# Patient Record
Sex: Female | Born: 2000 | Race: Black or African American | Hispanic: No | Marital: Single | Smoking: Never smoker
Health system: Southern US, Community
[De-identification: ages and names within clinical notes are randomized; demographics above are authoritative.]

## PROBLEM LIST (undated history)

## (undated) ENCOUNTER — Ambulatory Visit: Payer: Medicaid Other

## (undated) DIAGNOSIS — J45909 Unspecified asthma, uncomplicated: Secondary | ICD-10-CM

## (undated) DIAGNOSIS — H469 Unspecified optic neuritis: Secondary | ICD-10-CM

## (undated) HISTORY — DX: Unspecified optic neuritis: H46.9

---
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
## 2021-01-16 ENCOUNTER — Ambulatory Visit: Payer: Medicaid Other

## 2021-02-27 ENCOUNTER — Ambulatory Visit (INDEPENDENT_AMBULATORY_CARE_PROVIDER_SITE_OTHER): Payer: Medicaid Other

## 2021-02-27 ENCOUNTER — Other Ambulatory Visit: Payer: Self-pay

## 2021-02-27 VITALS — BP 116/77 | HR 90 | Ht 66.5 in | Wt 263.0 lb

## 2021-02-27 DIAGNOSIS — Z3042 Encounter for surveillance of injectable contraceptive: Secondary | ICD-10-CM

## 2021-02-27 DIAGNOSIS — Z01419 Encounter for gynecological examination (general) (routine) without abnormal findings: Secondary | ICD-10-CM

## 2021-02-27 DIAGNOSIS — Z6841 Body Mass Index (BMI) 40.0 and over, adult: Secondary | ICD-10-CM

## 2021-02-27 DIAGNOSIS — Z0000 Encounter for general adult medical examination without abnormal findings: Secondary | ICD-10-CM

## 2021-02-27 MED ORDER — MEDROXYPROGESTERONE ACETATE 150 MG/ML IM SUSP
150.0000 mg | INTRAMUSCULAR | Status: DC
  Administered 2021-02-27: 150 mg via INTRAMUSCULAR

## 2021-02-27 NOTE — Progress Notes (Signed)
GYNECOLOGY OFFICE VISIT NOTE-WELL WOMAN EXAM  History:   Breanna Dominguez [id number] here today for depo provera prescription and annual exam. She states that she had an appt last week with her PCP and completed all STD testing. She denies regular menses, but has some intermittent spotting.  She denies any abnormal vaginal discharge, pelvic pain or other concerns.   Birth Control:  Depo Provera-No problems, Satisfied.  Reproductive Concerns Sexually Active: Yes-Condom Usage-No pain or discomfort.  Partners Type: One Number of partners in last year: One STD Testing: None  Vaginal/GU Concerns: She reports some mild constipation and states she does nothing to treat. She denies issues with urination.  Breast Concerns/Exams: None; Checks breast and endorses SBA. Denies a family history of breast, uterine, cervical, or ovarian cancer  Medical and Nutrition PCP: [facility] Significant PMx:-None Exercise: Denies Tobacco/Drugs/Alcohol: None Nutrition: She states her intake "could be better." Has referral to nutritional services through PCP.    Social Safety at home: Endorses DV/A: Denies Social Support: Endorses Employment: Statistician School: [employer]  History reviewed. No pertinent past medical history.  History reviewed. No pertinent surgical history.  The following portions of the patient's history were reviewed and updated as appropriate: allergies, current medications, past family history, past medical history, past social history, past surgical history and problem list.   Health Maintenance:  No pap d/t age.  No mammogram d./t age.   Review of Systems:  Pertinent items noted in HPI and remainder of comprehensive ROS otherwise negative.    Objective:    Physical Exam BP 116/77   Pulse 90   Ht 5' 6.5" (1.689 m)   Wt 263 lb (119.3 kg)   BMI 41.81 kg/m  Physical Exam Vitals reviewed.  Constitutional:      General: She is not in acute distress.     Appearance: Normal appearance. She is obese. She is not ill-appearing.  HENT:     Head: Normocephalic and atraumatic.  Eyes:     Conjunctiva/sclera: Conjunctivae normal.  Neck:     Thyroid: No thyroid mass or thyroid tenderness.  Cardiovascular:     Rate and Rhythm: Normal rate and regular rhythm.     Heart sounds: Normal heart sounds.  Pulmonary:     Effort: Pulmonary effort is normal. No respiratory distress.     Breath sounds: Normal breath sounds.  Abdominal:     General: Bowel sounds are normal.     Palpations: Abdomen is soft.     Tenderness: There is no abdominal tenderness.  Musculoskeletal:        General: Normal range of motion.     Cervical back: Normal range of motion.     Right lower leg: No edema.     Left lower leg: No edema.  Skin:    General: Skin is warm and dry.  Neurological:     Mental Status: She is alert and oriented to person, place, and time.  Psychiatric:        Mood and Affect: Mood normal.        Behavior: Behavior normal.        Thought Content: Thought content normal.     Labs and Imaging No results found for this or any previous visit (from the past 168 hour(s)). No results found.   Assessment & Plan:  20 year old Female Well Woman Exam No Pap d/t Age Depo Provera Usage STD Screen Completed by PCP BMI 41.8  1. Well woman exam without gynecological  exam Brief review of well woman exam and what to expect: *Informed that formal speculum exam with pap smear to begin at 20 years old based on ASCCP guidelines. *Informed that CBE will start at age 72 based on ACOG guidelines unless other factors arise prior to that. -Encouraged to activate and utilize Mychart for review of labs, questions, and scheduling of appt.  -Educated on AHA exercise recommendations of 30 minutes of moderate to vigorous activity at least 5x/week. -Encouraged to continue balanced nutritional intake.  2. Encounter for management and injection of  depo-Provera -Prescription UTD as of 12/2020 and Refills of 3. -Will receive injection today. -Instructed to call pharmacy for filling of refills before going to pick up.  3. Adult BMI 40.0-44.9 kg/sq m (HCC) -BMI 41.81 -20lb weight gain since last annual exam with this provider. -Has nutritional consult planned via PCP. -Encouraged exercise.   Routine preventative health maintenance measures emphasized. Please refer to After Visit Summary for other counseling recommendations.   No follow-ups on file.      Cherre Robins, CNM 02/27/2021

## 2021-02-27 NOTE — Progress Notes (Signed)
Pt is in the office for annual. Pt reports that she previously did std testing at PCP, pt needs refills on depo rx today. Last depo injection in office was 12-12-2020 Administered depo injection today in RUOQ and pt tolerated well .Marland Kitchen Administrations This Visit     medroxyPROGESTERone (DEPO-PROVERA) injection 150 mg     Admin Date 02/27/2021 Action Given Dose 150 mg Route Intramuscular Administered By Katrina Stack, RN

## 2021-03-02 ENCOUNTER — Other Ambulatory Visit: Payer: Self-pay

## 2021-03-02 ENCOUNTER — Encounter (HOSPITAL_COMMUNITY): Payer: Self-pay | Admitting: *Deleted

## 2021-03-02 ENCOUNTER — Emergency Department (HOSPITAL_COMMUNITY)
Admission: EM | Admit: 2021-03-02 | Discharge: 2021-03-03 | Discharge status: Against Medical Advice | Payer: Medicaid Other | Attending: Emergency Medicine | Admitting: Emergency Medicine

## 2021-03-02 DIAGNOSIS — R519 Headache, unspecified: Secondary | ICD-10-CM | POA: Diagnosis not present

## 2021-03-02 DIAGNOSIS — Z5321 Procedure and treatment not carried out due to patient leaving prior to being seen by health care provider: Secondary | ICD-10-CM | POA: Diagnosis not present

## 2021-03-02 DIAGNOSIS — R112 Nausea with vomiting, unspecified: Secondary | ICD-10-CM | POA: Insufficient documentation

## 2021-03-02 LAB — COMPREHENSIVE METABOLIC PANEL
ALT: 20 U/L (ref 0–44)
AST: 20 U/L (ref 15–41)
Albumin: 3.8 g/dL (ref 3.5–5.0)
Alkaline Phosphatase: 63 U/L (ref 38–126)
Anion gap: 11 (ref 5–15)
BUN: 7 mg/dL (ref 6–20)
CO2: 20 mmol/L — ABNORMAL LOW (ref 22–32)
Calcium: 9.3 mg/dL (ref 8.9–10.3)
Chloride: 105 mmol/L (ref 98–111)
Creatinine, Ser: 0.94 mg/dL (ref 0.44–1.00)
GFR, Estimated: >60 mL/min (ref >60)
Glucose, Bld: 95 mg/dL (ref 70–99)
Potassium: 3.6 mmol/L (ref 3.5–5.1)
Sodium: 136 mmol/L (ref 135–145)
Total Bilirubin: 1.2 mg/dL (ref 0.3–1.2)
Total Protein: 7.1 g/dL (ref 6.5–8.1)

## 2021-03-02 LAB — LIPASE, BLOOD: Lipase: 22 U/L (ref 11–51)

## 2021-03-02 LAB — CBC WITH DIFFERENTIAL/PLATELET
Abs Immature Granulocytes: 0.07 10*3/uL (ref 0.00–0.07)
Basophils Absolute: 0.1 10*3/uL (ref 0.0–0.1)
Basophils Relative: 0 %
Eosinophils Absolute: 0 10*3/uL (ref 0.0–0.5)
Eosinophils Relative: 0 %
HCT: 42.5 % (ref 36.0–46.0)
Hemoglobin: 13.9 g/dL (ref 12.0–15.0)
Immature Granulocytes: 0 %
Lymphocytes Relative: 4 %
Lymphs Abs: 0.7 10*3/uL (ref 0.7–4.0)
MCH: 29 pg (ref 26.0–34.0)
MCHC: 32.7 g/dL (ref 30.0–36.0)
MCV: 88.5 fL (ref 80.0–100.0)
Monocytes Absolute: 1 10*3/uL (ref 0.1–1.0)
Monocytes Relative: 6 %
Neutro Abs: 15.1 10*3/uL — ABNORMAL HIGH (ref 1.7–7.7)
Neutrophils Relative %: 90 %
Platelets: 351 10*3/uL (ref 150–400)
RBC: 4.8 MIL/uL (ref 3.87–5.11)
RDW: 13.1 % (ref 11.5–15.5)
WBC: 17 10*3/uL — ABNORMAL HIGH (ref 4.0–10.5)
nRBC: 0 % (ref 0.0–0.2)

## 2021-03-02 LAB — I-STAT BETA HCG BLOOD, ED (MC, WL, AP ONLY): I-stat hCG, quantitative: <5 m[IU]/mL (ref <5)

## 2021-03-02 NOTE — ED Triage Notes (Signed)
Tylenol at 1830

## 2021-03-02 NOTE — ED Triage Notes (Signed)
The pt is c/o some nausea since this afternoon  headache then none now  lmp on depo shot

## 2021-03-02 NOTE — ED Provider Notes (Signed)
Emergency Medicine Provider Triage Evaluation Note  Breanna Dominguez , a 20 y.o. female  was evaluated in triage.  Pt complains of 3 days of nausea.  Also 1 episode of nonbloody vomiting earlier today.  Also endorses fever, patient is febrile at 102.5 in triage today.  Denies abdominal pain, however has generalized tenderness on exam.  No prior abdominal surgical history, unsure last LMP.  Review of Systems  Positive: Fevers, nausea, vomiting Negative: Diarrhea, vaginal discharge  Physical Exam  BP 98/68   Pulse (!) 129   Temp (!) 102.5 F (39.2 C)   Resp 20   Ht 5' 6.5" (1.689 m)   Wt 119.3 kg   SpO2 98%   BMI 41.81 kg/m  Gen:   Awake, no distress   Resp:  Normal effort  MSK:   Moves extremities without difficulty  Other:  Generalized tenderness noted on exam, peritoneal signs negative  Medical Decision Making  Medically screening exam initiated at 7:48 PM.  Appropriate orders placed.  RAYANA GEURIN was informed that the remainder of the evaluation will be completed by another provider, this initial triage assessment does not replace that evaluation, and the importance of remaining in the ED until their evaluation is complete.     Vear Clock 03/02/21 1953    Vanetta Mulders, MD 03/10/21 1322

## 2021-03-03 ENCOUNTER — Ambulatory Visit: Payer: Medicaid Other

## 2021-03-03 NOTE — ED Notes (Signed)
Pt stated she was leaving AMA 

## 2021-03-05 ENCOUNTER — Ambulatory Visit: Payer: Medicaid Other

## 2021-04-17 ENCOUNTER — Ambulatory Visit
Admission: EM | Admit: 2021-04-17 | Discharge: 2021-04-17 | Disposition: A | Discharge status: Home / Self Care | Payer: Medicaid Other | Attending: Internal Medicine | Admitting: Internal Medicine

## 2021-04-17 ENCOUNTER — Other Ambulatory Visit: Payer: Self-pay

## 2021-04-17 ENCOUNTER — Ambulatory Visit (INDEPENDENT_AMBULATORY_CARE_PROVIDER_SITE_OTHER): Payer: Medicaid Other

## 2021-04-17 ENCOUNTER — Encounter: Payer: Self-pay | Admitting: Emergency Medicine

## 2021-04-17 DIAGNOSIS — R051 Acute cough: Secondary | ICD-10-CM

## 2021-04-17 DIAGNOSIS — R0989 Other specified symptoms and signs involving the circulatory and respiratory systems: Secondary | ICD-10-CM

## 2021-04-17 DIAGNOSIS — R0602 Shortness of breath: Secondary | ICD-10-CM

## 2021-04-17 MED ORDER — ALBUTEROL SULFATE HFA 108 (90 BASE) MCG/ACT IN AERS: 1.0000 | INHALATION_SPRAY | Freq: Four times a day (QID) | RESPIRATORY_TRACT | 0 refills | Status: DC | PRN

## 2021-04-17 NOTE — Discharge Instructions (Signed)
You were prescribed albuterol inhaler today.  You were prescribed prednisone steroid by other healthcare provider.  Please pick this up from the pharmacy and start taking this.

## 2021-04-17 NOTE — ED Triage Notes (Signed)
Patient c/o chest congestion, headache, afebrile since Sunday.  COVID test negative.  Patient has been taken Tylenol.  Patient is vaccinated for COVID.

## 2021-04-17 NOTE — ED Provider Notes (Signed)
[facility]    CSN: [id number] Arrival date & time: 04/17/21  [id number]      History   Chief Complaint Chief Complaint  Patient presents with   Chest Congestion    HPI Breanna Dominguez is a 20 y.o. female.   Patient presents with "chest congestion, headache, nonproductive cough that has been present for approximately 5 days.  Patient denies any chest pain but does endorse some shortness of breath.  Denies any history of asthma or any chronic lung diseases.  Took an at home COVID-19 test that was negative.  Denies nasal congestion, ear pain, sore throat, nausea, vomiting, diarrhea, abdominal pain.  Denies any known fevers or sick contacts.  Has taken NyQuil and Tylenol with minimal improvement in symptoms.  Patient reports that she was seen at [facility] on 04/15/2021 and was prescribed a medication that she does not know the name of.  Patient's pharmacy was called, and pharmacy stated that prednisone had been prescribed for patient but she did not pick this medication up yet.    History reviewed. No pertinent past medical history.  There are no problems to display for this patient.   History reviewed. No pertinent surgical history.  OB History     Gravida  1   Para      Term      Preterm      AB  1   Living         SAB      IAB      Ectopic      Multiple      Live Births               Home Medications    Prior to Admission medications   Medication Sig Start Date End Date Taking? Authorizing Provider  albuterol (VENTOLIN HFA) 108 (90 Base) MCG/ACT inhaler Inhale 1-2 puffs into the lungs every 6 (six) hours as needed for wheezing or shortness of breath. 04/17/21  Yes Chandelle Harkey, Hildred Alamin E, FNP  medroxyPROGESTERone (DEPO-PROVERA) 150 MG/ML injection Inject 1 mL (150 mg total) into the muscle every 3 (three) months. 12/12/20  Yes Leftwich-Kirby, Kathie Dike, CNM  cetirizine (ZYRTEC) 10 MG tablet Take 10 mg by mouth daily. Patient not taking:  Reported on 02/27/2021    [provider]  gabapentin (NEURONTIN) 300 MG capsule Take 1 capsule (300 mg total) by mouth at bedtime. Patient not taking: Reported on 11/05/2020 09/16/20 10/16/20  Criselda Peaches, DPM  meloxicam (MOBIC) 15 MG tablet Take 1 tablet (15 mg total) by mouth daily. Patient not taking: No sig reported 09/08/20   Jaynee Eagles, PA-C  predniSONE (DELTASONE) 20 MG tablet Take 2 tablets (40 mg total) by mouth daily with breakfast. Patient not taking: No sig reported 11/05/20   Scot Jun, FNP  triamcinolone (KENALOG) 0.025 % ointment Apply 1 application topically 2 (two) times daily. Patient not taking: No sig reported 10/04/20   Janith Lima, PA-C    Family History Family History  Problem Relation Age of Onset   Healthy Mother    Healthy Father     Social History Social History   Tobacco Use   Smoking status: Never   Smokeless tobacco: Never  Vaping Use   Vaping Use: Never used  Substance Use Topics   Alcohol use: No   Drug use: No     Allergies   Apple, Banana, and Shrimp [shellfish allergy]   Review of Systems Review of Systems Per  HPI  Physical Exam Triage Vital Signs ED Triage Vitals  Enc Vitals Group     BP 04/17/21 1151 117/78     Pulse Rate 04/17/21 1151 62     Resp 04/17/21 1151 18     Temp 04/17/21 1151 97.9 F (36.6 C)     Temp Source 04/17/21 1151 Oral     SpO2 04/17/21 1151 98 %     Weight 04/17/21 1152 264 lb (119.7 kg)     Height 04/17/21 1152 5\' 6"  (1.676 m)     Head Circumference --      Peak Flow --      Pain Score 04/17/21 1152 0     Pain Loc --      Pain Edu? --      Excl. in GC? --    No data found.  Updated Vital Signs BP 117/78 (BP Location: Left Arm)   Pulse 62   Temp 97.9 F (36.6 C) (Oral)   Resp 18   Ht 5\' 6"  (1.676 m)   Wt 264 lb (119.7 kg)   SpO2 98%   BMI 42.61 kg/m   Visual Acuity Right Eye Distance:   Left Eye Distance:   Bilateral Distance:    Right Eye Near:   Left Eye  Near:    Bilateral Near:     Physical Exam Constitutional:      General: She is not in acute distress.    Appearance: Normal appearance.  HENT:     Head: Normocephalic and atraumatic.     Right Ear: Tympanic membrane and ear canal normal.     Left Ear: Tympanic membrane and ear canal normal.     Nose: Congestion present.     Mouth/Throat:     Mouth: Mucous membranes are moist.     Pharynx: No posterior oropharyngeal erythema.  Eyes:     Extraocular Movements: Extraocular movements intact.     Conjunctiva/sclera: Conjunctivae normal.     Pupils: Pupils are equal, round, and reactive to light.  Cardiovascular:     Rate and Rhythm: Normal rate and regular rhythm.     Pulses: Normal pulses.     Heart sounds: Normal heart sounds.  Pulmonary:     Effort: Pulmonary effort is normal. No respiratory distress.     Breath sounds: Normal breath sounds. No stridor. No wheezing, rhonchi or rales.  Abdominal:     General: Abdomen is flat. Bowel sounds are normal.     Palpations: Abdomen is soft.  Musculoskeletal:        General: Normal range of motion.     Cervical back: Normal range of motion.  Skin:    General: Skin is warm and dry.  Neurological:     General: No focal deficit present.     Mental Status: She is alert and oriented to person, place, and time. Mental status is at baseline.  Psychiatric:        Mood and Affect: Mood normal.        Behavior: Behavior normal.     UC Treatments / Results  Labs (all labs ordered are listed, but only abnormal results are displayed) Labs Reviewed - No data to display  EKG   Radiology DG Chest 2 View  Result Date: 04/17/2021 CLINICAL DATA:  Shortness of breath. EXAM: CHEST - 2 VIEW COMPARISON:  January 11, 2017. FINDINGS: The heart size and mediastinal contours are within normal limits. Both lungs are clear. The visualized skeletal structures are unremarkable. IMPRESSION: No  active cardiopulmonary disease. Electronically Signed   By:  Marijo Conception M.D.   On: 04/17/2021 12:46    Procedures Procedures (including critical care time)  Medications Ordered in UC Medications - No data to display  Initial Impression / Assessment and Plan / UC Course  I have reviewed the triage vital signs and the nursing notes.  Pertinent labs & imaging results that were available during my care of the patient were reviewed by me and considered in my medical decision making (see chart for details).     Patient presents with symptoms likely from a viral upper respiratory infection. Differential includes bacterial pneumonia, sinusitis, allergic rhinitis, COVID-19. Do not suspect underlying cardiopulmonary process. Symptoms seem unlikely related to ACS, CHF or COPD exacerbations, pneumonia, pneumothorax. Patient is nontoxic appearing and not in need of emergent medical intervention.  Recommended symptom control with over the counter medications: Daily oral anti-histamine, Oral decongestant or IN corticosteroid, saline irrigations, cepacol lozenges, Robitussin, Delsym, honey tea.  Chest x-ray was normal.  Advised patient to pick up prednisone that was prescribed by other healthcare provider.  Will add albuterol inhaler to take as needed for shortness of breath.  No red flags seen on exam.  Return if symptoms fail to improve in 1-2 weeks. Patient states understanding and is agreeable.  Discharged with PCP followup.  Final Clinical Impressions(s) / UC Diagnoses   Final diagnoses:  Chest congestion  Acute cough     Discharge Instructions      You were prescribed albuterol inhaler today.  You were prescribed prednisone steroid by other healthcare provider.  Please pick this up from the pharmacy and start taking this.     ED Prescriptions     Medication Sig Dispense Auth. Provider   albuterol (VENTOLIN HFA) 108 (90 Base) MCG/ACT inhaler Inhale 1-2 puffs into the lungs every 6 (six) hours as needed for wheezing or shortness of breath.  1 each Teodora Medici, [place]      PDMP not reviewed this encounter.   Teodora Medici, [place] 04/17/21 1328

## 2021-05-13 ENCOUNTER — Other Ambulatory Visit: Payer: Self-pay

## 2021-05-13 ENCOUNTER — Encounter (HOSPITAL_COMMUNITY): Payer: Self-pay | Admitting: Emergency Medicine

## 2021-05-13 ENCOUNTER — Ambulatory Visit (HOSPITAL_COMMUNITY)
Admission: EM | Admit: 2021-05-13 | Discharge: 2021-05-13 | Disposition: A | Discharge status: Home / Self Care | Payer: Medicaid Other | Attending: Urgent Care | Admitting: Urgent Care

## 2021-05-13 DIAGNOSIS — Z3202 Encounter for pregnancy test, result negative: Secondary | ICD-10-CM | POA: Diagnosis not present

## 2021-05-13 DIAGNOSIS — R051 Acute cough: Secondary | ICD-10-CM | POA: Diagnosis present

## 2021-05-13 DIAGNOSIS — Z202 Contact with and (suspected) exposure to infections with a predominantly sexual mode of transmission: Secondary | ICD-10-CM | POA: Insufficient documentation

## 2021-05-13 LAB — POC URINE PREG, ED: Preg Test, Ur: NEGATIVE

## 2021-05-13 MED ORDER — CEFTRIAXONE SODIUM 500 MG IJ SOLR
500.0000 mg | Freq: Once | INTRAMUSCULAR | Status: AC
  Administered 2021-05-13: 500 mg via INTRAMUSCULAR

## 2021-05-13 MED ORDER — LIDOCAINE HCL (PF) 1 % IJ SOLN
INTRAMUSCULAR | Status: AC
  Filled 2021-05-13: qty 2

## 2021-05-13 MED ORDER — CEFTRIAXONE SODIUM 500 MG IJ SOLR
INTRAMUSCULAR | Status: AC
  Filled 2021-05-13: qty 500

## 2021-05-13 MED ORDER — PROMETHAZINE-DM 6.25-15 MG/5ML PO SYRP: 5.0000 mL | ORAL_SOLUTION | Freq: Four times a day (QID) | ORAL | 0 refills | Status: DC | PRN

## 2021-05-13 NOTE — ED Provider Notes (Signed)
[place]    CSN: [id number] Arrival date & time: 05/13/21  1629      History   Chief Complaint Chief Complaint  Patient presents with   Cough   Exposure to STD    HPI Breanna Dominguez is a 20 y.o. female.   Pt reports she was sick about three weeks ago with cough and congestion and was improving, but the cough became worse over the last few days.  She is taking nyquil with minimal improvement of cough.  She denies fever, chills, congestion, sinus pain or pressure. Denies wheezing. She was prescribed an inhaler at last visit.     She also presents for STD testing, reports her boyfriend recently tested positive for gonorrhea.  She is requesting treatment today.  Denies vaginal discharge, itching, rash, pelvic pain.    History reviewed. No pertinent past medical history.  There are no problems to display for this patient.   History reviewed. No pertinent surgical history.  OB History     Gravida  1   Para      Term      Preterm      AB  1   Living         SAB      IAB      Ectopic      Multiple      Live Births               Home Medications    Prior to Admission medications   Medication Sig Start Date End Date Taking? Authorizing Provider  promethazine-dextromethorphan (PROMETHAZINE-DM) 6.25-15 MG/5ML syrup Take 5 mLs by mouth 4 (four) times daily as needed for cough. 05/13/21  Yes Ward, Lenise Arena, PA-C  albuterol (VENTOLIN HFA) 108 (90 Base) MCG/ACT inhaler Inhale 1-2 puffs into the lungs every 6 (six) hours as needed for wheezing or shortness of breath. 04/17/21   Teodora Medici, FNP  cetirizine (ZYRTEC) 10 MG tablet Take 10 mg by mouth daily. Patient not taking: Reported on 02/27/2021    [provider]  gabapentin (NEURONTIN) 300 MG capsule Take 1 capsule (300 mg total) by mouth at bedtime. Patient not taking: Reported on 11/05/2020 09/16/20 10/16/20  Criselda Peaches, DPM  medroxyPROGESTERone (DEPO-PROVERA) 150 MG/ML  injection Inject 1 mL (150 mg total) into the muscle every 3 (three) months. 12/12/20   Leftwich-Kirby, Kathie Dike, CNM  meloxicam (MOBIC) 15 MG tablet Take 1 tablet (15 mg total) by mouth daily. Patient not taking: No sig reported 09/08/20   Jaynee Eagles, PA-C  predniSONE (DELTASONE) 20 MG tablet Take 2 tablets (40 mg total) by mouth daily with breakfast. Patient not taking: No sig reported 11/05/20   Scot Jun, FNP  triamcinolone (KENALOG) 0.025 % ointment Apply 1 application topically 2 (two) times daily. Patient not taking: No sig reported 10/04/20   Janith Lima, PA-C    Family History Family History  Problem Relation Age of Onset   Healthy Mother    Healthy Father     Social History Social History   Tobacco Use   Smoking status: Never   Smokeless tobacco: Never  Vaping Use   Vaping Use: Never used  Substance Use Topics   Alcohol use: No   Drug use: No     Allergies   Apple, Banana, and Shrimp [shellfish allergy]   Review of Systems Review of Systems  Constitutional:  Negative for chills and fever.  HENT:  Negative for ear pain  and sore throat.   Eyes:  Negative for pain and visual disturbance.  Respiratory:  Positive for cough. Negative for shortness of breath.   Cardiovascular:  Negative for chest pain and palpitations.  Gastrointestinal:  Negative for abdominal pain and vomiting.  Genitourinary:  Negative for difficulty urinating, dysuria, hematuria, pelvic pain, vaginal discharge and vaginal pain.  Musculoskeletal:  Negative for arthralgias and back pain.  Skin:  Negative for color change and rash.  Neurological:  Negative for seizures and syncope.  All other systems reviewed and are negative.   Physical Exam Triage Vital Signs ED Triage Vitals  Enc Vitals Group     BP 05/13/21 1713 117/75     Pulse Rate 05/13/21 1713 95     Resp 05/13/21 1713 16     Temp 05/13/21 1713 98.5 F (36.9 C)     Temp Source 05/13/21 1713 Oral     SpO2 05/13/21 1713 100  %     Weight --      Height --      Head Circumference --      Peak Flow --      Pain Score 05/13/21 1711 0     Pain Loc --      Pain Edu? --      Excl. in [place]? --    No data found.  Updated Vital Signs BP 117/75 (BP Location: Right Arm)   Pulse 95   Temp 98.5 F (36.9 C) (Oral)   Resp 16   SpO2 100%   Visual Acuity Right Eye Distance:   Left Eye Distance:   Bilateral Distance:    Right Eye Near:   Left Eye Near:    Bilateral Near:     Physical Exam Vitals and nursing note reviewed.  Constitutional:      General: She is not in acute distress.    Appearance: She is well-developed.  HENT:     Head: Normocephalic and atraumatic.  Eyes:     Conjunctiva/sclera: Conjunctivae normal.  Cardiovascular:     Rate and Rhythm: Normal rate and regular rhythm.     Heart sounds: No murmur heard. Pulmonary:     Effort: Pulmonary effort is normal. No respiratory distress.     Breath sounds: Normal breath sounds.  Abdominal:     Palpations: Abdomen is soft.     Tenderness: There is no abdominal tenderness.  Musculoskeletal:        General: No swelling.     Cervical back: Neck supple.  Skin:    General: Skin is warm and dry.     Capillary Refill: Capillary refill takes less than 2 seconds.  Neurological:     Mental Status: She is alert.  Psychiatric:        Mood and Affect: Mood normal.     UC Treatments / Results  Labs (all labs ordered are listed, but only abnormal results are displayed) Labs Reviewed  POC URINE PREG, ED  CERVICOVAGINAL ANCILLARY ONLY    EKG   Radiology No results found.  Procedures Procedures (including critical care time)  Medications Ordered in UC Medications  cefTRIAXone (ROCEPHIN) injection 500 mg (has no administration in time range)    Initial Impression / Assessment and Plan / UC Course  I have reviewed the triage vital signs and the nursing notes.  Pertinent labs & imaging results that were available during my care of the  patient were reviewed by me and considered in my medical decision making (see chart for details).  Persistent cough following URI, cough syrup prescribed. Advised mucinex.  Pt stable, lungs clear, vitals wnl, afebrile.  Stable for discharge.   Partner treated for gonorrhea, will treat today.  Labs pending.  Return precautions discussed.  Final Clinical Impressions(s) / UC Diagnoses   Final diagnoses:  Acute cough  Exposure to STD     Discharge Instructions      Take cough syrup as needed Recommend Mucinex and Flonase  Will call with test results     ED Prescriptions     Medication Sig Dispense Auth. Provider   promethazine-dextromethorphan (PROMETHAZINE-DM) 6.25-15 MG/5ML syrup Take 5 mLs by mouth 4 (four) times daily as needed for cough. 118 mL Ward, Lenise Arena, PA-C      PDMP not reviewed this encounter.   Ward, Lenise Arena, PA-C 05/13/21 1745

## 2021-05-13 NOTE — ED Triage Notes (Signed)
Pt presents with on going cough. States also exposed to STD recently. Denies any symptoms at this time.

## 2021-05-13 NOTE — Discharge Instructions (Addendum)
Take cough syrup as needed Recommend Mucinex and Flonase Return if symptoms become worse  Will call with test results

## 2021-05-14 LAB — CERVICOVAGINAL ANCILLARY ONLY
Bacterial Vaginitis (gardnerella): NEGATIVE
Candida Glabrata: NEGATIVE
Candida Vaginitis: NEGATIVE
Chlamydia: NEGATIVE
Comment: NEGATIVE
Comment: NEGATIVE
Comment: NEGATIVE
Comment: NEGATIVE
Comment: NEGATIVE
Comment: NORMAL
Neisseria Gonorrhea: POSITIVE — AB
Trichomonas: NEGATIVE

## 2021-05-21 ENCOUNTER — Ambulatory Visit: Payer: Medicaid Other

## 2021-05-29 ENCOUNTER — Other Ambulatory Visit: Payer: Self-pay

## 2021-05-29 ENCOUNTER — Ambulatory Visit (INDEPENDENT_AMBULATORY_CARE_PROVIDER_SITE_OTHER): Payer: Medicaid Other

## 2021-05-29 VITALS — BP 111/75 | HR 83 | Ht 66.0 in | Wt 260.0 lb

## 2021-05-29 DIAGNOSIS — Z3042 Encounter for surveillance of injectable contraceptive: Secondary | ICD-10-CM | POA: Diagnosis not present

## 2021-05-29 MED ORDER — MEDROXYPROGESTERONE ACETATE 150 MG/ML IM SUSP
150.0000 mg | Freq: Once | INTRAMUSCULAR | Status: AC
  Administered 2021-05-29: 16:00:00 150 mg via INTRAMUSCULAR

## 2021-05-29 NOTE — Progress Notes (Signed)
Depo Provera 150mg  given IM LUOQ. Pt tolerated well with no adverse side effects noted. Pt to return to clinic between 08/14/21-08/28/21 for repeat injection. Pt is up to date on yearly exam, not due until 02/2022.  Office stock was used for patient's injection today. Pt was advised to pick up Rx from pharmacy before next injection.

## 2021-06-11 ENCOUNTER — Other Ambulatory Visit: Payer: Self-pay

## 2021-06-11 ENCOUNTER — Ambulatory Visit (HOSPITAL_COMMUNITY)
Admission: EM | Admit: 2021-06-11 | Discharge: 2021-06-11 | Disposition: A | Discharge status: Home / Self Care | Payer: Medicaid Other | Attending: Emergency Medicine | Admitting: Emergency Medicine

## 2021-06-11 DIAGNOSIS — H5711 Ocular pain, right eye: Secondary | ICD-10-CM

## 2021-06-11 MED ORDER — FLUORESCEIN SODIUM 1 MG OP STRP
ORAL_STRIP | OPHTHALMIC | Status: AC
  Filled 2021-06-11: qty 1

## 2021-06-11 MED ORDER — POLYMYXIN B-TRIMETHOPRIM 10000-0.1 UNIT/ML-% OP SOLN: 1.0000 [drp] | OPHTHALMIC | 0 refills | Status: DC

## 2021-06-11 MED ORDER — KETOROLAC TROMETHAMINE 0.5 % OP SOLN: 1.0000 [drp] | Freq: Four times a day (QID) | OPHTHALMIC | 0 refills | Status: DC

## 2021-06-11 NOTE — ED Triage Notes (Signed)
Pt presents to the office for right eye pain and pressure. No injury to eye.

## 2021-06-11 NOTE — ED Provider Notes (Signed)
[facility]    CSN: [id number] Arrival date & time: 06/11/21  1555      History   Chief Complaint Chief Complaint  Patient presents with   Eye Problem    HPI Breanna Dominguez is a 21 y.o. female.   Patient presents with right pain for 4 days occurring with is touched and when eye is moved into the right visual field.  Symptoms began abruptly.  endorses that generalized headache and blurred vision began today.  Has attempted use of Benadryl which was not helpful.  Denies discharge, itching, redness, light sensitivity.  Wears glasses daily, denies use of contacts. most recent eye exam 3 weeks ago.    No past medical history on file.  There are no problems to display for this patient.   No past surgical history on file.  OB History     Gravida  1   Para      Term      Preterm      AB  1   Living         SAB      IAB      Ectopic      Multiple      Live Births               Home Medications    Prior to Admission medications   Medication Sig Start Date End Date Taking? Authorizing Provider  albuterol (VENTOLIN HFA) 108 (90 Base) MCG/ACT inhaler Inhale 1-2 puffs into the lungs every 6 (six) hours as needed for wheezing or shortness of breath. 04/17/21   Gustavus Bryant, FNP  cetirizine (ZYRTEC) 10 MG tablet Take 10 mg by mouth daily. Patient not taking: Reported on 02/27/2021    [provider]  gabapentin (NEURONTIN) 300 MG capsule Take 1 capsule (300 mg total) by mouth at bedtime. Patient not taking: Reported on 11/05/2020 09/16/20 10/16/20  Edwin Cap, DPM  medroxyPROGESTERone (DEPO-PROVERA) 150 MG/ML injection Inject 1 mL (150 mg total) into the muscle every 3 (three) months. 12/12/20   Leftwich-Kirby, Wilmer Floor, CNM  meloxicam (MOBIC) 15 MG tablet Take 1 tablet (15 mg total) by mouth daily. Patient not taking: No sig reported 09/08/20   Wallis Bamberg, PA-C  predniSONE (DELTASONE) 20 MG tablet Take 2 tablets (40 mg total) by mouth daily  with breakfast. Patient not taking: No sig reported 11/05/20   Bing Neighbors, FNP  promethazine-dextromethorphan (PROMETHAZINE-DM) 6.25-15 MG/5ML syrup Take 5 mLs by mouth 4 (four) times daily as needed for cough. Patient not taking: Reported on 05/29/2021 05/13/21   Ward, Shanda Bumps Z, PA-C  triamcinolone (KENALOG) 0.025 % ointment Apply 1 application topically 2 (two) times daily. Patient not taking: No sig reported 10/04/20   Lew Dawes, PA-C    Family History Family History  Problem Relation Age of Onset   Healthy Mother    Healthy Father     Social History Social History   Tobacco Use   Smoking status: Never   Smokeless tobacco: Never  Vaping Use   Vaping Use: Never used  Substance Use Topics   Alcohol use: No   Drug use: No     Allergies   Apple, Banana, and Shrimp [shellfish allergy]   Review of Systems Review of Systems  Constitutional: Negative.   Eyes:  Positive for pain and visual disturbance. Negative for photophobia, discharge, redness and itching.  Respiratory: Negative.    Cardiovascular: Negative.   Skin: Negative.  Physical Exam Triage Vital Signs ED Triage Vitals  Enc Vitals Group     BP 06/11/21 1738 (!) 149/86     Pulse Rate 06/11/21 1738 97     Resp 06/11/21 1738 18     Temp 06/11/21 1738 99.3 F (37.4 C)     Temp Source 06/11/21 1738 Oral     SpO2 06/11/21 1738 100 %     Weight --      Height --      Head Circumference --      Peak Flow --      Pain Score 06/11/21 1739 0     Pain Loc --      Pain Edu? --      Excl. in [place]? --    No data found.  Updated Vital Signs BP (!) 149/86 (BP Location: Left Arm)    Pulse 97    Temp 99.3 F (37.4 C) (Oral)    Resp 18    SpO2 100%   Visual Acuity Right Eye Distance:   Left Eye Distance:   Bilateral Distance:    Right Eye Near:   Left Eye Near:    Bilateral Near:     Physical Exam Constitutional:      Appearance: Normal appearance. She is normal weight.  HENT:     Head:  Normocephalic.  Eyes:     General: Lids are normal. Lids are everted, no foreign bodies appreciated. Vision grossly intact. Gaze aligned appropriately.        Right eye: No foreign body, discharge or hordeolum.     Extraocular Movements: Extraocular movements intact.     Conjunctiva/sclera: Conjunctivae normal.  Pulmonary:     Effort: Pulmonary effort is normal.  Skin:    General: Skin is warm and dry.  Neurological:     Mental Status: She is alert and oriented to person, place, and time. Mental status is at baseline.  Psychiatric:        Mood and Affect: Mood normal.        Behavior: Behavior normal.     UC Treatments / Results  Labs (all labs ordered are listed, but only abnormal results are displayed) Labs Reviewed - No data to display  EKG   Radiology No results found.  Procedures Procedures (including critical care time)  Medications Ordered in UC Medications - No data to display  Initial Impression / Assessment and Plan / UC Course  I have reviewed the triage vital signs and the nursing notes.  Pertinent labs & imaging results that were available during my care of the patient were reviewed by me and considered in my medical decision making (see chart for details).  Right eye pain  No abnormality seen on fluorescein staining, discussed with patient, recommended follow-up with ophthalmologist if symptoms possible for further evaluation, will cover for infection and for pain, prescribed Polytrim and ocular, recommend over-the-counter Tylenol and ibuprofen for further management, urgent care follow-up as needed Final Clinical Impressions(s) / UC Diagnoses   Final diagnoses:  None   Discharge Instructions   None    ED Prescriptions   None    PDMP not reviewed this encounter.   Hans Eden, NP 06/11/21 1840

## 2021-06-11 NOTE — Discharge Instructions (Signed)
The cause of your eye pain is unknown at this time, the stain completed on your eye did no show anything of concern  Please notify your eye doctor and schedule an appointment for evaluation   In the meantime we will over for infection and help to manage pain  Place 1 drop of polytrim into eye every 4 hours for 7 days  May use 1 drop acular every 6 hours for pain   May use over the counter ibuprofen 800 mg ever 8 hours for pain in addition to tylenol 1000 mg every 6 hours as needed

## 2021-06-12 ENCOUNTER — Emergency Department (HOSPITAL_COMMUNITY): Payer: Medicaid Other

## 2021-06-12 ENCOUNTER — Encounter (HOSPITAL_COMMUNITY): Payer: Self-pay | Admitting: Emergency Medicine

## 2021-06-12 ENCOUNTER — Inpatient Hospital Stay (HOSPITAL_COMMUNITY)
Admission: EM | Admit: 2021-06-12 | Discharge: 2021-06-17 | DRG: 123 | Disposition: A | Discharge status: Home / Self Care | Payer: Medicaid Other | Attending: Internal Medicine | Admitting: Internal Medicine

## 2021-06-12 DIAGNOSIS — Z91018 Allergy to other foods: Secondary | ICD-10-CM

## 2021-06-12 DIAGNOSIS — Z833 Family history of diabetes mellitus: Secondary | ICD-10-CM

## 2021-06-12 DIAGNOSIS — J45909 Unspecified asthma, uncomplicated: Secondary | ICD-10-CM | POA: Diagnosis present

## 2021-06-12 DIAGNOSIS — Z6841 Body Mass Index (BMI) 40.0 and over, adult: Secondary | ICD-10-CM

## 2021-06-12 DIAGNOSIS — R739 Hyperglycemia, unspecified: Secondary | ICD-10-CM | POA: Diagnosis present

## 2021-06-12 DIAGNOSIS — T380X5A Adverse effect of glucocorticoids and synthetic analogues, initial encounter: Secondary | ICD-10-CM | POA: Diagnosis present

## 2021-06-12 DIAGNOSIS — Z20822 Contact with and (suspected) exposure to covid-19: Secondary | ICD-10-CM | POA: Diagnosis present

## 2021-06-12 DIAGNOSIS — H469 Unspecified optic neuritis: Principal | ICD-10-CM | POA: Diagnosis present

## 2021-06-12 DIAGNOSIS — Z91013 Allergy to seafood: Secondary | ICD-10-CM

## 2021-06-12 HISTORY — DX: Morbid (severe) obesity due to excess calories: E66.01

## 2021-06-12 HISTORY — DX: Unspecified asthma, uncomplicated: J45.909

## 2021-06-12 LAB — CBC WITH DIFFERENTIAL/PLATELET
Abs Immature Granulocytes: 0.02 10*3/uL (ref 0.00–0.07)
Basophils Absolute: 0.1 10*3/uL (ref 0.0–0.1)
Basophils Relative: 1 %
Eosinophils Absolute: 0.2 10*3/uL (ref 0.0–0.5)
Eosinophils Relative: 3 %
HCT: 42.9 % (ref 36.0–46.0)
Hemoglobin: 14.1 g/dL (ref 12.0–15.0)
Immature Granulocytes: 0 %
Lymphocytes Relative: 22 %
Lymphs Abs: 1.6 10*3/uL (ref 0.7–4.0)
MCH: 29.1 pg (ref 26.0–34.0)
MCHC: 32.9 g/dL (ref 30.0–36.0)
MCV: 88.6 fL (ref 80.0–100.0)
Monocytes Absolute: 0.4 10*3/uL (ref 0.1–1.0)
Monocytes Relative: 5 %
Neutro Abs: 4.9 10*3/uL (ref 1.7–7.7)
Neutrophils Relative %: 69 %
Platelets: 366 10*3/uL (ref 150–400)
RBC: 4.84 MIL/uL (ref 3.87–5.11)
RDW: 13.1 % (ref 11.5–15.5)
WBC: 7.2 10*3/uL (ref 4.0–10.5)
nRBC: 0 % (ref 0.0–0.2)

## 2021-06-12 LAB — BASIC METABOLIC PANEL
Anion gap: 6 (ref 5–15)
BUN: 9 mg/dL (ref 6–20)
CO2: 23 mmol/L (ref 22–32)
Calcium: 9 mg/dL (ref 8.9–10.3)
Chloride: 107 mmol/L (ref 98–111)
Creatinine, Ser: 0.9 mg/dL (ref 0.44–1.00)
GFR, Estimated: >60 mL/min (ref >60)
Glucose, Bld: 164 mg/dL — ABNORMAL HIGH (ref 70–99)
Potassium: 3.8 mmol/L (ref 3.5–5.1)
Sodium: 136 mmol/L (ref 135–145)

## 2021-06-12 MED ORDER — GADOBUTROL 1 MMOL/ML IV SOLN
10.0000 mL | Freq: Once | INTRAVENOUS | Status: AC | PRN
  Administered 2021-06-12: 10 mL via INTRAVENOUS

## 2021-06-12 MED ORDER — IBUPROFEN 400 MG PO TABS
400.0000 mg | ORAL_TABLET | Freq: Once | ORAL | Status: AC | PRN
  Administered 2021-06-12: 400 mg via ORAL
  Filled 2021-06-12: qty 1

## 2021-06-12 NOTE — ED Provider Triage Note (Signed)
Emergency Medicine Provider Triage Evaluation Note  Breanna Dominguez , a 21 y.o. female  was evaluated in triage.  Pt complains of right-sided vision loss.  He states for 5 days she has had intermittent headache and pain in her right eye.  She states that yesterday she had more severe headache and blurry vision.  She states that she was seen by urgent care yesterday who recommended follow-up with ophthalmologist.  She was seen by Dr. Dione Booze, ophthalmologist, today and was sent to the emergency department for optic neuritis work-up.  Was sent with note stating that she needed immediate MRI with and without contrast.  Note also states that Dr. Fabian Sharp has contacted neuro hospitalist Dr. Jerrell Belfast.  Dr. Dione Booze states that Dr. Jerrell Belfast was supposed to get in contact with Dr. Selina Cooley.   Review of Systems  Positive: See above Negative:  Physical Exam  BP 135/84 (BP Location: Left Arm)    Pulse 90    Temp 98.1 F (36.7 C) (Oral)    Resp 14    SpO2 100%  Gen:   Awake, no distress   Resp:  Normal effort  MSK:   Moves extremities without difficulty  Other:  Visual fields slightly diminished on the right.  Medical Decision Making  Medically screening exam initiated at 2:53 PM.  Appropriate orders placed.  Breanna Dominguez was informed that the remainder of the evaluation will be completed by another provider, this initial triage assessment does not replace that evaluation, and the importance of remaining in the ED until their evaluation is complete.  Spoke with Dr. Selina Cooley, neuro hospitalist who requests MRI brain and orbits both with and without contrast. Ordered.    Cristopher Peru, PA-C 06/12/21 [phone]

## 2021-06-12 NOTE — ED Triage Notes (Signed)
Patient sent to [facility] by eye doctor for evaluation of possible optic neuritis in righteye. Patient reports pain that started on Saturday, then yesterday noticed decreased vision. Patient alert oriented and in no apparent distress at this time.

## 2021-06-13 ENCOUNTER — Encounter (HOSPITAL_COMMUNITY): Payer: Self-pay | Admitting: Internal Medicine

## 2021-06-13 ENCOUNTER — Inpatient Hospital Stay (HOSPITAL_COMMUNITY): Payer: Medicaid Other

## 2021-06-13 DIAGNOSIS — Z91018 Allergy to other foods: Secondary | ICD-10-CM | POA: Diagnosis not present

## 2021-06-13 DIAGNOSIS — Z91013 Allergy to seafood: Secondary | ICD-10-CM | POA: Diagnosis not present

## 2021-06-13 DIAGNOSIS — Z833 Family history of diabetes mellitus: Secondary | ICD-10-CM | POA: Diagnosis not present

## 2021-06-13 DIAGNOSIS — Z6841 Body Mass Index (BMI) 40.0 and over, adult: Secondary | ICD-10-CM | POA: Diagnosis not present

## 2021-06-13 DIAGNOSIS — H469 Unspecified optic neuritis: Secondary | ICD-10-CM | POA: Diagnosis present

## 2021-06-13 DIAGNOSIS — T380X5A Adverse effect of glucocorticoids and synthetic analogues, initial encounter: Secondary | ICD-10-CM | POA: Diagnosis present

## 2021-06-13 DIAGNOSIS — R739 Hyperglycemia, unspecified: Secondary | ICD-10-CM | POA: Diagnosis present

## 2021-06-13 DIAGNOSIS — E66813 Obesity, class 3: Secondary | ICD-10-CM | POA: Diagnosis present

## 2021-06-13 DIAGNOSIS — R519 Headache, unspecified: Secondary | ICD-10-CM | POA: Diagnosis present

## 2021-06-13 DIAGNOSIS — J45909 Unspecified asthma, uncomplicated: Secondary | ICD-10-CM | POA: Diagnosis present

## 2021-06-13 DIAGNOSIS — Z20822 Contact with and (suspected) exposure to covid-19: Secondary | ICD-10-CM | POA: Diagnosis present

## 2021-06-13 LAB — HEMOGLOBIN A1C
Hgb A1c MFr Bld: 5 % (ref 4.8–5.6)
Mean Plasma Glucose: 96.8 mg/dL

## 2021-06-13 LAB — RESP PANEL BY RT-PCR (FLU A&B, COVID) ARPGX2
Influenza A by PCR: NEGATIVE
Influenza B by PCR: NEGATIVE
SARS Coronavirus 2 by RT PCR: NEGATIVE

## 2021-06-13 LAB — HIV ANTIBODY (ROUTINE TESTING W REFLEX): HIV Screen 4th Generation wRfx: NONREACTIVE

## 2021-06-13 LAB — TSH: TSH: 0.89 u[IU]/mL (ref 0.350–4.500)

## 2021-06-13 MED ORDER — ACETAMINOPHEN 650 MG RE SUPP: 650.0000 mg | Freq: Four times a day (QID) | RECTAL | Status: DC | PRN

## 2021-06-13 MED ORDER — GADOBUTROL 1 MMOL/ML IV SOLN
10.0000 mL | Freq: Once | INTRAVENOUS | Status: AC | PRN
  Administered 2021-06-13: 10 mL via INTRAVENOUS

## 2021-06-13 MED ORDER — PANTOPRAZOLE SODIUM 40 MG PO TBEC
40.0000 mg | DELAYED_RELEASE_TABLET | Freq: Every day | ORAL | Status: DC
  Administered 2021-06-13 – 2021-06-17 (×3): 40 mg via ORAL
  Filled 2021-06-13 (×4): qty 1

## 2021-06-13 MED ORDER — ACETAMINOPHEN 325 MG PO TABS
650.0000 mg | ORAL_TABLET | Freq: Four times a day (QID) | ORAL | Status: DC | PRN
  Administered 2021-06-14: 650 mg via ORAL
  Filled 2021-06-13: qty 2

## 2021-06-13 MED ORDER — ENOXAPARIN SODIUM 40 MG/0.4ML IJ SOSY
40.0000 mg | PREFILLED_SYRINGE | INTRAMUSCULAR | Status: DC
  Administered 2021-06-16: 40 mg via SUBCUTANEOUS
  Filled 2021-06-13 (×2): qty 0.4

## 2021-06-13 MED ORDER — SODIUM CHLORIDE 0.9 % IV SOLN
1000.0000 mg | Freq: Every day | INTRAVENOUS | Status: AC
  Administered 2021-06-13 – 2021-06-17 (×5): 1000 mg via INTRAVENOUS
  Filled 2021-06-13 (×7): qty 16

## 2021-06-13 MED ORDER — ALBUTEROL SULFATE (2.5 MG/3ML) 0.083% IN NEBU: 2.5000 mg | INHALATION_SOLUTION | Freq: Four times a day (QID) | RESPIRATORY_TRACT | Status: DC | PRN

## 2021-06-13 NOTE — ED Notes (Signed)
Patient transported to MRI 

## 2021-06-13 NOTE — Consult Note (Signed)
Neurology Consultation  Reason for Consult: Evaluation for optic neuritis Referring Physician: Coralee Pesa, DO  CC: R eye pain and blurred vision  History is obtained from: Patient  HPI: Breanna Dominguez is a 21 y.o. female with no significant PMHx but a recent illness in December 2022 who presented from her ophthalmologist's office after a [facility] visit and consulted to neurology for evaluation of optic neuritis. Patient reports a recent illness in early to mid-December, c/b dyspnea which required an inhaler (patient unsure of type); this illness was resolved by Christmas. She reports that Saturday, 12/31, she noticed some pain to palpation of her R eye as well as a headache. By Tuesday, 1/3, she had completely blurred vision in her R eye in addition to the aforementioned. She reports mild photophobia as well as pain with EOM and while crying this morning, but denies involuntary tearing, scleral and conjunctival erythema, purulence, changes to colors/dimming, or any changes to her L eye. She denies further associated sx including weakness of any extremities, bowel/bladder changes, abdominal and chest pain, and N/V/D.    ROS: A complete ROS was performed and is negative except as noted in the HPI.  Unable to obtain due to altered mental status.   Past Medical History:  Diagnosis Date   Asthma     Family History  Problem Relation Age of Onset   Healthy Mother    Healthy Father   No autoimmune diagnoses reported/ known  Social History:   reports that she has never smoked. She has never used smokeless tobacco. She reports that she does not drink alcohol and does not use drugs.  Medications  Current Facility-Administered Medications:    acetaminophen (TYLENOL) tablet 650 mg, 650 mg, Oral, Q6H PRN **OR** acetaminophen (TYLENOL) suppository 650 mg, 650 mg, Rectal, Q6H PRN, Smith, Rondell A, MD   albuterol (PROVENTIL) (2.5 MG/3ML) 0.083% nebulizer solution 2.5 mg, 2.5 mg,  Nebulization, Q6H PRN, Katrinka Blazing, Rondell A, MD   enoxaparin (LOVENOX) injection 40 mg, 40 mg, Subcutaneous, Q24H, Smith, Rondell A, MD   medroxyPROGESTERone (DEPO-PROVERA) injection 150 mg, 150 mg, Intramuscular, Q90 days, Emly, Jessica, CNM, 150 mg at 02/27/21 1105   methylPREDNISolone sodium succinate (SOLU-MEDROL) 1,000 mg in sodium chloride 0.9 % 50 mL IVPB, 1,000 mg, Intravenous, Daily, Enrico Eaddy, MD   pantoprazole (PROTONIX) EC tablet 40 mg, 40 mg, Oral, Daily, Smith, Rondell A, MD  Current Outpatient Medications:    albuterol (VENTOLIN HFA) 108 (90 Base) MCG/ACT inhaler, Inhale 1-2 puffs into the lungs every 6 (six) hours as needed for wheezing or shortness of breath., Disp: 1 each, Rfl: 0   cetirizine (ZYRTEC) 10 MG tablet, Take 10 mg by mouth daily. (Patient not taking: Reported on 02/27/2021), Disp: , Rfl:    gabapentin (NEURONTIN) 300 MG capsule, Take 1 capsule (300 mg total) by mouth at bedtime. (Patient not taking: Reported on 11/05/2020), Disp: 30 capsule, Rfl: 0   ketorolac (ACULAR) 0.5 % ophthalmic solution, Place 1 drop into both eyes every 6 (six) hours. (Patient not taking: Reported on 06/12/2021), Disp: 5 mL, Rfl: 0   medroxyPROGESTERone (DEPO-PROVERA) 150 MG/ML injection, Inject 1 mL (150 mg total) into the muscle every 3 (three) months. (Patient not taking: Reported on 06/12/2021), Disp: 1 mL, Rfl: 3   meloxicam (MOBIC) 15 MG tablet, Take 1 tablet (15 mg total) by mouth daily. (Patient not taking: Reported on 12/12/2020), Disp: 30 tablet, Rfl: 1   predniSONE (DELTASONE) 20 MG tablet, Take 2 tablets (40 mg total) by  mouth daily with breakfast. (Patient not taking: Reported on 12/12/2020), Disp: 10 tablet, Rfl: 0   promethazine-dextromethorphan (PROMETHAZINE-DM) 6.25-15 MG/5ML syrup, Take 5 mLs by mouth 4 (four) times daily as needed for cough. (Patient not taking: Reported on 05/29/2021), Disp: 118 mL, Rfl: 0   triamcinolone (KENALOG) 0.025 % ointment, Apply 1 application topically 2  (two) times daily. (Patient not taking: Reported on 12/12/2020), Disp: 30 g, Rfl: 0   trimethoprim-polymyxin b (POLYTRIM) ophthalmic solution, Place 1 drop into the right eye every 4 (four) hours. (Patient not taking: Reported on 06/12/2021), Disp: 10 mL, Rfl: 0   Exam: Current vital signs: BP (!) 124/59    Pulse 72    Temp 98.9 F (37.2 C) (Oral)    Resp 15    SpO2 100%  Vital signs in last 24 hours: Temp:  [98.1 F (36.7 C)-98.9 F (37.2 C)] 98.9 F (37.2 C) (01/06 0030) Pulse Rate:  [72-90] 72 (01/06 1130) Resp:  [14-20] 15 (01/06 1130) BP: (120-135)/(59-90) 124/59 (01/06 1130) SpO2:  [98 %-100 %] 100 % (01/06 1130)  GENERAL: Awake, alert, in no acute distress Psych: Affect appropriate for situation, patient is calm and cooperative with examination Head: Normocephalic and atraumatic, without obvious abnormality EENT: Normal conjunctivae, dry mucous membranes, no OP obstruction LUNGS: Normal respiratory effort. CTAB. Non-labored breathing on room air. CV: Regular rate and rhythm. No abnormal heart sounds auscultated ABDOMEN: Soft, non-tender, non-distended Extremities: warm, well perfused, without obvious deformity  NEURO:  Mental Status: Awake, alert, and oriented to person, place, time, and situation. She is able to provide a clear and coherent history of present illness. Speech/Language: speech is normal;   Naming, repetition, fluency, and comprehension intact without aphasia  No neglect is noted Cranial Nerves:  II: PUR, RL in L eye; APD in R eye. 3 mm R eye, 2 mm L eye. visual fields full, but with difficulty in R eye 2/2 blurring. On vision card, L eye 20/10, R eye 20/40. III, IV, VI: EOMI, painful. Lid elevation symmetric and full.  V: Sensation is intact to light touch and symmetrical to face. Blinks to threat. Moves jaw back and forth.  VII: Face is symmetric resting and smiling. Able to puff cheeks and raise eyebrows.  VIII: Hearing intact to voice IX, X: Palate  elevation is symmetric. Phonation normal.  XI: Normal sternocleidomastoid and trapezius muscle strength XII: Tongue protrudes midline without fasciculations.   Motor: 5/5 strength is all muscle groups.  Tone is normal. Bulk is normal.  Sensation: Intact to light touch bilaterally in all four extremities. No extinction to DSS present.  Coordination: FTN intact bilaterally. HKS intact bilaterally. Alternating hand movements.  DTRs: 2+ throughout.  Gait: Normal   Labs I have reviewed labs in epic and the results pertinent to this consultation are:   CBC    Component Value Date/Time   WBC 7.2 06/12/2021 1505   RBC 4.84 06/12/2021 1505   HGB 14.1 06/12/2021 1505   HCT 42.9 06/12/2021 1505   PLT 366 06/12/2021 1505   MCV 88.6 06/12/2021 1505   MCH 29.1 06/12/2021 1505   MCHC 32.9 06/12/2021 1505   RDW 13.1 06/12/2021 1505   LYMPHSABS 1.6 06/12/2021 1505   MONOABS 0.4 06/12/2021 1505   EOSABS 0.2 06/12/2021 1505   BASOSABS 0.1 06/12/2021 1505    CMP     Component Value Date/Time   NA 136 06/12/2021 1505   K 3.8 06/12/2021 1505   CL 107 06/12/2021 1505   CO2 23  06/12/2021 1505   GLUCOSE 164 (H) 06/12/2021 1505   BUN 9 06/12/2021 1505   CREATININE 0.90 06/12/2021 1505   CALCIUM 9.0 06/12/2021 1505   PROT 7.1 03/02/2021 1946   ALBUMIN 3.8 03/02/2021 1946   AST 20 03/02/2021 1946   ALT 20 03/02/2021 1946   ALKPHOS 63 03/02/2021 1946   BILITOT 1.2 03/02/2021 1946   GFRNONAA >60 06/12/2021 1505   Resp Panel by RT-PCR (Flu A&B, Covid) Nasopharyngeal Swab  Specimen Information: Nasopharyngeal Swab; Nasopharyngeal(NP) swabs in vial transport medium  0 Result Notes Component Ref Range & Units 10:24 1 yr ago  SARS Coronavirus 2 by RT PCR NEGATIVE NEGATIVE  POSITIVE Abnormal  CM   Comment: (NOTE)  SARS-CoV-2 target nucleic acids are NOT DETECTED.       Imaging I have reviewed the images obtained:  MRI examination of the brain and orbits w/wo contrast: No demyelinating  lesions present. Obvious inflammation of and edema surrounding optic nerve. Bulging of R optic nerve disc.   Fundoscopy from ophthalmologist also shows bulging of R optic nerve disc.  Assessment: Janal's history and imaging are consistent with acutely onset optic neuritis. At this time, it appears that this may be an isolated incident, likely post-viral in nature, but we cannot yet rule out early multiple sclerosis without further imaging.    Recommendations: - Admit to medical floor for treatment - Initiate Solumedrol 1000 mg IV daily x 5 days for treatment of optic neuritis - Obtain MRI cervical and thoracic spine w/wo contrast to establish baseline and for evaluation of demyelinating lesions; no lesions found on MRI brain - Neurology will continue to follow   Pt seen by Neuro Psych resident MD and later with attending MD. Note/plan to be edited by MD as needed.   Lamar Sprinkles, MD PGY-1 06/13/2021

## 2021-06-13 NOTE — ED Provider Notes (Addendum)
[facility] EMERGENCY DEPARTMENT Provider Note   CSN: [id number] Arrival date & time: 06/12/21  1306     History  Chief Complaint  Patient presents with   Eye Pain    Breanna Dominguez is a 21 y.o. female.  HPI  21 year old female with past medical history of asthma presents emergency department concern for right eye pain and blurred vision.  Patient states her symptoms have been going on for the past 6 days.  Initially it started with blurred vision just in the right eye field, progressing to pain in the right eyes specifically with certain eye movements and touching of the eye.  There is never been any eye redness or swelling.  No vision loss.  She states that the blurred vision is in the entirety of the right eye visual field.  No left eye symptoms.  She is had intermittent right-sided headache associated with the eye pain.  Does not wear contacts, no recent eyeglass prescription change, no new medications, no trauma to the eye.  No family history of autoimmune diseases that she is aware of.  No associated facial droop, speech change, focal weakness or numbness.  Home Medications Prior to Admission medications   Medication Sig Start Date End Date Taking? Authorizing Provider  albuterol (VENTOLIN HFA) 108 (90 Base) MCG/ACT inhaler Inhale 1-2 puffs into the lungs every 6 (six) hours as needed for wheezing or shortness of breath. 04/17/21  Yes Mound, Hildred Alamin E, FNP  cetirizine (ZYRTEC) 10 MG tablet Take 10 mg by mouth daily. Patient not taking: Reported on 02/27/2021    [provider]  gabapentin (NEURONTIN) 300 MG capsule Take 1 capsule (300 mg total) by mouth at bedtime. Patient not taking: Reported on 11/05/2020 09/16/20 10/16/20  Criselda Peaches, DPM  ketorolac (ACULAR) 0.5 % ophthalmic solution Place 1 drop into both eyes every 6 (six) hours. Patient not taking: Reported on 06/12/2021 06/11/21   Hans Eden, NP  medroxyPROGESTERone (DEPO-PROVERA) 150 MG/ML  injection Inject 1 mL (150 mg total) into the muscle every 3 (three) months. Patient not taking: Reported on 06/12/2021 12/12/20   Leftwich-Kirby, Kathie Dike, CNM  meloxicam (MOBIC) 15 MG tablet Take 1 tablet (15 mg total) by mouth daily. Patient not taking: Reported on 12/12/2020 09/08/20   Jaynee Eagles, PA-C  predniSONE (DELTASONE) 20 MG tablet Take 2 tablets (40 mg total) by mouth daily with breakfast. Patient not taking: Reported on 12/12/2020 11/05/20   Scot Jun, FNP  promethazine-dextromethorphan (PROMETHAZINE-DM) 6.25-15 MG/5ML syrup Take 5 mLs by mouth 4 (four) times daily as needed for cough. Patient not taking: Reported on 05/29/2021 05/13/21   Ward, Janett Billow Z, PA-C  triamcinolone (KENALOG) 0.025 % ointment Apply 1 application topically 2 (two) times daily. Patient not taking: Reported on 12/12/2020 10/04/20   Wieters, Madelynn Done C, PA-C  trimethoprim-polymyxin b (POLYTRIM) ophthalmic solution Place 1 drop into the right eye every 4 (four) hours. Patient not taking: Reported on 06/12/2021 06/11/21   Hans Eden, NP      Allergies    Apple, Banana, and Shrimp [shellfish allergy]    Review of Systems   Review of Systems  Constitutional:  Negative for chills and fever.  HENT:  Negative for congestion.   Eyes:  Positive for photophobia, pain and visual disturbance. Negative for discharge and redness.  Respiratory:  Negative for shortness of breath.   Cardiovascular:  Negative for chest pain.  Gastrointestinal:  Negative for abdominal pain and nausea.  Musculoskeletal:  Negative  for back pain, neck pain and neck stiffness.  Skin:  Negative for rash.  Neurological:  Negative for facial asymmetry, weakness, numbness and headaches.   Physical Exam Updated Vital Signs BP 131/90    Pulse 84    Temp 98.9 F (37.2 C) (Oral)    Resp 14    SpO2 100%  Physical Exam Vitals and nursing note reviewed.  Constitutional:      General: She is not in acute distress.    Appearance: Normal appearance.   HENT:     Head: Normocephalic.     Right Ear: External ear normal.     Left Ear: External ear normal.     Nose: Nose normal.     Mouth/Throat:     Mouth: Mucous membranes are moist.  Eyes:     General: No scleral icterus.    Extraocular Movements: Extraocular movements intact.     Conjunctiva/sclera: Conjunctivae normal.     Pupils: Pupils are equal, round, and reactive to light.     Comments: Unable to perform full ocular exam as the patient is currently in the hallway  Cardiovascular:     Rate and Rhythm: Normal rate.  Pulmonary:     Effort: Pulmonary effort is normal. No respiratory distress.  Musculoskeletal:     Cervical back: No rigidity.  Skin:    General: Skin is warm.  Neurological:     General: No focal deficit present.     Mental Status: She is alert and oriented to person, place, and time. Mental status is at baseline.     Cranial Nerves: No cranial nerve deficit.     Comments: Reported blurred vision in the right eye but otherwise neuro intact  Psychiatric:        Mood and Affect: Mood normal.    ED Results / Procedures / Treatments   Labs (all labs ordered are listed, but only abnormal results are displayed) Labs Reviewed  BASIC METABOLIC PANEL - Abnormal; Notable for the following components:      Result Value   Glucose, Bld 164 (*)    All other components within normal limits  CBC WITH DIFFERENTIAL/PLATELET    EKG None  Radiology MR Brain W and Wo Contrast  Result Date: 06/12/2021 CLINICAL DATA:  Initial evaluation for optic neuritis. EXAM: MRI HEAD AND ORBITS WITHOUT AND WITH CONTRAST TECHNIQUE: Multiplanar, multiecho pulse sequences of the brain and surrounding structures were obtained without and with intravenous contrast. Multiplanar, multiecho pulse sequences of the orbits and surrounding structures were obtained including fat saturation techniques, before and after intravenous contrast administration. CONTRAST:  2mL GADAVIST GADOBUTROL 1 MMOL/ML  IV SOLN COMPARISON:  None available. FINDINGS: MRI HEAD FINDINGS Brain: Rule cerebral volume within normal limits. No focal parenchymal signal abnormality. No abnormal foci of restricted diffusion to suggest acute or subacute ischemia. Gray-white matter differentiation maintained. No areas of chronic cortical infarction or other insult. No evidence for acute or chronic intracranial hemorrhage. No mass lesion, midline shift or mass effect. No hydrocephalus or extra-axial fluid collection. Pituitary gland suprasellar region normal. Midline structures intact. No abnormal enhancement. Vascular: Major intracranial vascular flow voids are maintained. Skull and upper cervical spine: Craniocervical junction normal. Bone marrow signal intensity within normal limits. No scalp soft tissue abnormality. Other: Mastoid air cells are clear. MRI ORBITS FINDINGS Orbits: Mild flattening of the posterior contour of the right globe as compared to the left with bulging of the optic nerve disc. Diffuse edema with enhancement and inflammatory stranding seen about  the right optic nerve, most pronounced just posterior to the right globe (series 15, image 10). Findings consistent with acute optic neuritis. No abnormality seen about the left globe for left optic nerve. Orbital apices within normal limits. No abnormality about the cavernous sinus. Optic chiasm normally situated within the suprasellar cistern without abnormality. Superior orbital veins symmetric and normal. Extra-ocular muscles within normal limits. Lacrimal glands normal. Visualized sinuses: Visualized paranasal sinuses are largely clear. Soft tissues: Unremarkable. IMPRESSION: 1. Findings consistent with acute right optic neuritis. 2. Otherwise normal MRI of the brain and orbits. Electronically Signed   By: Jeannine Boga M.D.   On: 06/12/2021 21:36   MR ORBITS W WO CONTRAST  Result Date: 06/12/2021 CLINICAL DATA:  Initial evaluation for optic neuritis. EXAM: MRI  HEAD AND ORBITS WITHOUT AND WITH CONTRAST TECHNIQUE: Multiplanar, multiecho pulse sequences of the brain and surrounding structures were obtained without and with intravenous contrast. Multiplanar, multiecho pulse sequences of the orbits and surrounding structures were obtained including fat saturation techniques, before and after intravenous contrast administration. CONTRAST:  64mL GADAVIST GADOBUTROL 1 MMOL/ML IV SOLN COMPARISON:  None available. FINDINGS: MRI HEAD FINDINGS Brain: Rule cerebral volume within normal limits. No focal parenchymal signal abnormality. No abnormal foci of restricted diffusion to suggest acute or subacute ischemia. Gray-white matter differentiation maintained. No areas of chronic cortical infarction or other insult. No evidence for acute or chronic intracranial hemorrhage. No mass lesion, midline shift or mass effect. No hydrocephalus or extra-axial fluid collection. Pituitary gland suprasellar region normal. Midline structures intact. No abnormal enhancement. Vascular: Major intracranial vascular flow voids are maintained. Skull and upper cervical spine: Craniocervical junction normal. Bone marrow signal intensity within normal limits. No scalp soft tissue abnormality. Other: Mastoid air cells are clear. MRI ORBITS FINDINGS Orbits: Mild flattening of the posterior contour of the right globe as compared to the left with bulging of the optic nerve disc. Diffuse edema with enhancement and inflammatory stranding seen about the right optic nerve, most pronounced just posterior to the right globe (series 15, image 10). Findings consistent with acute optic neuritis. No abnormality seen about the left globe for left optic nerve. Orbital apices within normal limits. No abnormality about the cavernous sinus. Optic chiasm normally situated within the suprasellar cistern without abnormality. Superior orbital veins symmetric and normal. Extra-ocular muscles within normal limits. Lacrimal glands  normal. Visualized sinuses: Visualized paranasal sinuses are largely clear. Soft tissues: Unremarkable. IMPRESSION: 1. Findings consistent with acute right optic neuritis. 2. Otherwise normal MRI of the brain and orbits. Electronically Signed   By: Jeannine Boga M.D.   On: 06/12/2021 21:36    Procedures Procedures    Medications Ordered in ED Medications  ibuprofen (ADVIL) tablet 400 mg (400 mg Oral Given 06/12/21 1839)  gadobutrol (GADAVIST) 1 MMOL/ML injection 10 mL (10 mLs Intravenous Contrast Given 06/12/21 2025)  gadobutrol (GADAVIST) 1 MMOL/ML injection 10 mL (10 mLs Intravenous Contrast Given 06/12/21 2026)    ED Course/ Medical Decision Making/ A&P                           Medical Decision Making   Of note patient had been waiting in the emergency department approximately 18 hours prior to the start of my shift, had MRI that was completed prior to my evaluation.  Patient seen immediately at the start of my shift.   This patient presents to the ED for concern of right eye pain and blurred vision, this  involves an extensive number of treatment options, and is a complaint that carries with it a high risk of complications and morbidity.  The differential diagnosis includes optic neuritis, ocular injury, glaucoma, iritis   Additional history obtained: -Additional history obtained from patient and mother -External records from outside source obtained and reviewed including: Chart review including previous notes, labs, imaging, consultation notes   Lab Tests: -I ordered, reviewed, and interpreted labs.  The pertinent results include:  no abnormalitites   Imaging Studies ordered: -I ordered imaging studies including MRI with and without contrast -I independently visualized and interpreted imaging which showed right optic neuritis -I agree with the radiologist interpretation   Consultations Obtained: I requested consultation with the neurology, Dr. Quinn Axe,  and discussed lab  and imaging findings as well as pertinent plan - they recommend: Medical admission, further MRI imaging, possibly IV steroids   ED Course: 21 year old female presents emergency department with right eye pain and blurred vision.  Optic neuritis diagnosed on the MRI with and without contrast.  Neurology consulted and has evaluated the patient.  There are no other findings of MS on the brain scan but they recommend medical admission, further MRI imaging of the cervical and thoracic spine and further treatment.  I spoke with the patient's mother with the patient's permission and updated her on the MRI findings and plan for admission   Critical Interventions: Neurology consult   Cardiac Monitoring: The patient was maintained on a cardiac monitor.  I personally viewed and interpreted the cardiac monitored which showed an underlying rhythm of: NSR   Reevaluation: After the interventions noted above, I reevaluated the patient and found that they have :stayed the same   Dispostion: Patients evaluation and results requires admission for further treatment and care.  Spoke with hospitalist Dr. Tamala Julian, reviewed patient's ED course and they accept admission.  Patient agrees with admission plan, offers no new complaints and is stable/unchanged at time of admit.         Final Clinical Impression(s) / ED Diagnoses Final diagnoses:  None    Rx / DC Orders ED Discharge Orders     None         Lorelle Gibbs, DO 06/13/21 1049    Marlinda Miranda, Alvin Critchley, DO 06/13/21 1103

## 2021-06-13 NOTE — H&P (Addendum)
History and Physical    Breanna Dominguez XIP:[id number] DOB: 2001-02-02 DOA: 06/12/2021  Referring MD/NP/PA: Coralee Pesa, DO PCP: [facility]  Patient coming from: Home  Chief Complaint: Right eye pain and blurry vision  I have personally briefly reviewed patient's old medical records in [facility]   HPI: Breanna Dominguez is a 21 y.o. female with medical history significant of asthma and morbid obesity presents with complaints of right eye pain and blurry vision.  Symptoms started 6 days ago with right eye discomfort and pain on the right side of temple.  3 days ago patient reports developing headache and blurry vision just of the right.  The following day she went to urgent care and was advised to follow up ophthalmologist.  She went to see the ophthalmologist yesterday and was referred to urgent care as there was concern for optic neuritis.  She denies having any fever, chest pain, palpitations, focal weakness, nausea, vomiting, diarrhea, or dysuria.  Her family history is significant for diabetes in her maternal grandmother, but denies any knowledge of any neurological disorders.  Records note patient had last been seen at urgent care on 12/6 with complaints of cough and noted to be positive for gonorrhea treated with Rocephin 500 mg and given a prescription for cough medicine.  ED Course: Upon admission into the emergency department patient was noted to have stable vital signs.  CBC and BMP were relatively unremarkable except for glucose 164.  MRI with and without contrast and of the orbits consistent with acute right optic neuritis.  Neurology consulted recommending high-dose Solu-Medrol 1000 mg daily.  TRH called to admit.  Review of Systems  Eyes:  Positive for blurred vision and pain.  Genitourinary:  Negative for dysuria and frequency.  Neurological:  Positive for headaches.  Otherwise a 12 point review of systems was performed and negative except for  noted above in HPI  History reviewed. No pertinent past medical history.  No past surgical history on file.   reports that she has never smoked. She has never used smokeless tobacco. She reports that she does not drink alcohol and does not use drugs.  Allergies  Allergen Reactions   Apple     Itchy throat    Banana     Itchy throat   Shrimp [Shellfish Allergy]     Itchy throat    Family History  Problem Relation Age of Onset   Healthy Mother    Healthy Father     Prior to Admission medications   Medication Sig Start Date End Date Taking? Authorizing Provider  albuterol (VENTOLIN HFA) 108 (90 Base) MCG/ACT inhaler Inhale 1-2 puffs into the lungs every 6 (six) hours as needed for wheezing or shortness of breath. 04/17/21  Yes Mound, Rolly Salter E, FNP  cetirizine (ZYRTEC) 10 MG tablet Take 10 mg by mouth daily. Patient not taking: Reported on 02/27/2021    [provider]  gabapentin (NEURONTIN) 300 MG capsule Take 1 capsule (300 mg total) by mouth at bedtime. Patient not taking: Reported on 11/05/2020 09/16/20 10/16/20  Edwin Cap, DPM  ketorolac (ACULAR) 0.5 % ophthalmic solution Place 1 drop into both eyes every 6 (six) hours. Patient not taking: Reported on 06/12/2021 06/11/21   Valinda Hoar, NP  medroxyPROGESTERone (DEPO-PROVERA) 150 MG/ML injection Inject 1 mL (150 mg total) into the muscle every 3 (three) months. Patient not taking: Reported on 06/12/2021 12/12/20   Hurshel Party, CNM  meloxicam (MOBIC) 15  MG tablet Take 1 tablet (15 mg total) by mouth daily. Patient not taking: Reported on 12/12/2020 09/08/20   Wallis Bamberg, PA-C  predniSONE (DELTASONE) 20 MG tablet Take 2 tablets (40 mg total) by mouth daily with breakfast. Patient not taking: Reported on 12/12/2020 11/05/20   Bing Neighbors, FNP  promethazine-dextromethorphan (PROMETHAZINE-DM) 6.25-15 MG/5ML syrup Take 5 mLs by mouth 4 (four) times daily as needed for cough. Patient not taking: Reported on  05/29/2021 05/13/21   Ward, Shanda Bumps Z, PA-C  triamcinolone (KENALOG) 0.025 % ointment Apply 1 application topically 2 (two) times daily. Patient not taking: Reported on 12/12/2020 10/04/20   Wieters, Fran Lowes C, PA-C  trimethoprim-polymyxin b (POLYTRIM) ophthalmic solution Place 1 drop into the right eye every 4 (four) hours. Patient not taking: Reported on 06/12/2021 06/11/21   Valinda Hoar, NP    Physical Exam:  Constitutional: Young female currently in NAD, calm, comfortable Vitals:   06/13/21 0030 06/13/21 0649 06/13/21 0814 06/13/21 0930  BP: 121/72 133/82 131/90 124/88  Pulse: 78 82 84 81  Resp: Temp: 98.9 F (37.2 C)     TempSrc: Oral     SpO2: 100% 98% 100% 99%   Eyes: PERRL, with reports of blurred vision out of the right eye  ENMT: Mucous membranes are moist. Posterior pharynx clear of any exudate or lesions.  Neck: normal, supple, no masses, no thyromegaly Respiratory: clear to auscultation bilaterally, no wheezing, no crackles. Normal respiratory effort. No accessory muscle use.  Cardiovascular: Regular rate and rhythm, no murmurs / rubs / gallops.  Musculoskeletal: no clubbing / cyanosis. No joint deformity upper and lower extremities. Skin: no rashes Neurologic: CN 2-12 grossly intact. Strength 5/5 in all 4.  Psychiatric: Normal judgment and insight. Alert and oriented x 3. Normal mood.     Labs on Admission: I have personally reviewed following labs and imaging studies  CBC: Recent Labs  Lab 06/12/21 1505  WBC 7.2  NEUTROABS 4.9  HGB 14.1  HCT 42.9  MCV 88.6  PLT 366   Basic Metabolic Panel: Recent Labs  Lab 06/12/21 1505  NA 136  K 3.8  CL 107  CO2 23  GLUCOSE 164*  BUN 9  CREATININE 0.90  CALCIUM 9.0   GFR: CrCl cannot be calculated (Unknown ideal weight.). Liver Function Tests: No results for input(s): AST, ALT, ALKPHOS, BILITOT, PROT, ALBUMIN in the last 168 hours. No results for input(s): LIPASE, AMYLASE in the last 168  hours. No results for input(s): AMMONIA in the last 168 hours. Coagulation Profile: No results for input(s): INR, PROTIME in the last 168 hours. Cardiac Enzymes: No results for input(s): CKTOTAL, CKMB, CKMBINDEX, TROPONINI in the last 168 hours. BNP (last 3 results) No results for input(s): PROBNP in the last 8760 hours. HbA1C: No results for input(s): HGBA1C in the last 72 hours. CBG: No results for input(s): GLUCAP in the last 168 hours. Lipid Profile: No results for input(s): CHOL, HDL, LDLCALC, TRIG, CHOLHDL, LDLDIRECT in the last 72 hours. Thyroid Function Tests: No results for input(s): TSH, T4TOTAL, FREET4, T3FREE, THYROIDAB in the last 72 hours. Anemia Panel: No results for input(s): VITAMINB12, FOLATE, FERRITIN, TIBC, IRON, RETICCTPCT in the last 72 hours. Urine analysis:    Component Value Date/Time   BILIRUBINUR negative 12/25/2020 1409   KETONESUR negative 12/25/2020 1409   PROTEINUR =30 (A) 12/25/2020 1409   UROBILINOGEN 1.0 12/25/2020 1409   NITRITE Negative 12/25/2020 1409   LEUKOCYTESUR Negative 12/25/2020 1409   Sepsis  Labs: No results found for this or any previous visit (from the past 240 hour(s)).   Radiological Exams on Admission: MR Brain W and Wo Contrast  Result Date: 06/12/2021 CLINICAL DATA:  Initial evaluation for optic neuritis. EXAM: MRI HEAD AND ORBITS WITHOUT AND WITH CONTRAST TECHNIQUE: Multiplanar, multiecho pulse sequences of the brain and surrounding structures were obtained without and with intravenous contrast. Multiplanar, multiecho pulse sequences of the orbits and surrounding structures were obtained including fat saturation techniques, before and after intravenous contrast administration. CONTRAST:  70mL GADAVIST GADOBUTROL 1 MMOL/ML IV SOLN COMPARISON:  None available. FINDINGS: MRI HEAD FINDINGS Brain: Rule cerebral volume within normal limits. No focal parenchymal signal abnormality. No abnormal foci of restricted diffusion to suggest acute  or subacute ischemia. Gray-white matter differentiation maintained. No areas of chronic cortical infarction or other insult. No evidence for acute or chronic intracranial hemorrhage. No mass lesion, midline shift or mass effect. No hydrocephalus or extra-axial fluid collection. Pituitary gland suprasellar region normal. Midline structures intact. No abnormal enhancement. Vascular: Major intracranial vascular flow voids are maintained. Skull and upper cervical spine: Craniocervical junction normal. Bone marrow signal intensity within normal limits. No scalp soft tissue abnormality. Other: Mastoid air cells are clear. MRI ORBITS FINDINGS Orbits: Mild flattening of the posterior contour of the right globe as compared to the left with bulging of the optic nerve disc. Diffuse edema with enhancement and inflammatory stranding seen about the right optic nerve, most pronounced just posterior to the right globe (series 15, image 10). Findings consistent with acute optic neuritis. No abnormality seen about the left globe for left optic nerve. Orbital apices within normal limits. No abnormality about the cavernous sinus. Optic chiasm normally situated within the suprasellar cistern without abnormality. Superior orbital veins symmetric and normal. Extra-ocular muscles within normal limits. Lacrimal glands normal. Visualized sinuses: Visualized paranasal sinuses are largely clear. Soft tissues: Unremarkable. IMPRESSION: 1. Findings consistent with acute right optic neuritis. 2. Otherwise normal MRI of the brain and orbits. Electronically Signed   By: Rise Mu M.D.   On: 06/12/2021 21:36   MR ORBITS W WO CONTRAST  Result Date: 06/12/2021 CLINICAL DATA:  Initial evaluation for optic neuritis. EXAM: MRI HEAD AND ORBITS WITHOUT AND WITH CONTRAST TECHNIQUE: Multiplanar, multiecho pulse sequences of the brain and surrounding structures were obtained without and with intravenous contrast. Multiplanar, multiecho pulse  sequences of the orbits and surrounding structures were obtained including fat saturation techniques, before and after intravenous contrast administration. CONTRAST:  5mL GADAVIST GADOBUTROL 1 MMOL/ML IV SOLN COMPARISON:  None available. FINDINGS: MRI HEAD FINDINGS Brain: Rule cerebral volume within normal limits. No focal parenchymal signal abnormality. No abnormal foci of restricted diffusion to suggest acute or subacute ischemia. Gray-white matter differentiation maintained. No areas of chronic cortical infarction or other insult. No evidence for acute or chronic intracranial hemorrhage. No mass lesion, midline shift or mass effect. No hydrocephalus or extra-axial fluid collection. Pituitary gland suprasellar region normal. Midline structures intact. No abnormal enhancement. Vascular: Major intracranial vascular flow voids are maintained. Skull and upper cervical spine: Craniocervical junction normal. Bone marrow signal intensity within normal limits. No scalp soft tissue abnormality. Other: Mastoid air cells are clear. MRI ORBITS FINDINGS Orbits: Mild flattening of the posterior contour of the right globe as compared to the left with bulging of the optic nerve disc. Diffuse edema with enhancement and inflammatory stranding seen about the right optic nerve, most pronounced just posterior to the right globe (series 15, image 10). Findings consistent with  acute optic neuritis. No abnormality seen about the left globe for left optic nerve. Orbital apices within normal limits. No abnormality about the cavernous sinus. Optic chiasm normally situated within the suprasellar cistern without abnormality. Superior orbital veins symmetric and normal. Extra-ocular muscles within normal limits. Lacrimal glands normal. Visualized sinuses: Visualized paranasal sinuses are largely clear. Soft tissues: Unremarkable. IMPRESSION: 1. Findings consistent with acute right optic neuritis. 2. Otherwise normal MRI of the brain and  orbits. Electronically Signed   By: Rise Mu M.D.   On: 06/12/2021 21:36      Assessment/Plan Optic neuritis: Acute.  Patient presents with difficulty seeing out of her right eye and pain.  MRI significant for acute right optic neuritis.  Neurology consulted and started patient on high-dose steroids. -Admit to a MedSurg bed -Continue Solu-Medrol 1 g IV daily day 1 of 5 ordered -Follow-up MRI of the cervical and thoracic spine -Appreciate neurology consultative services, will follow-up for any further recommendation  Hyperglycemia: Acute.  On admission glucose was elevated up to 164, prior to patient receiving any steroids.  She reports having a maternal grandmother with diabetes. -Add on hemoglobin A1c -Continue to monitor and placed on sliding scale of insulin if blood sugars trending greater than 180.  History of STI: 05/13/21 noted to be positive for gonorrhea treated with Rocephin 500 mg.    Morbid obesity: Last available BMI calculated at 41.8kg/m2  GI prophylaxis: Protonix given high-dose steroids DVT prophylaxis: Lovenox Code Status: Full Family Communication: None requested Disposition Plan: Hopefully discharge home once medically stable Consults called: Neurology Admission status: Inpatient, due to need of high-dose IV steroid  Clydie Braun MD [facility]   If 7PM-7AM, please contact night-coverage   06/13/2021, 10:04 AM

## 2021-06-14 ENCOUNTER — Encounter (HOSPITAL_COMMUNITY): Payer: Self-pay | Admitting: Internal Medicine

## 2021-06-14 DIAGNOSIS — R739 Hyperglycemia, unspecified: Secondary | ICD-10-CM

## 2021-06-14 DIAGNOSIS — H469 Unspecified optic neuritis: Principal | ICD-10-CM

## 2021-06-14 LAB — CBC
HCT: 40.8 % (ref 36.0–46.0)
Hemoglobin: 14.2 g/dL (ref 12.0–15.0)
MCH: 29.7 pg (ref 26.0–34.0)
MCHC: 34.8 g/dL (ref 30.0–36.0)
MCV: 85.4 fL (ref 80.0–100.0)
Platelets: 408 10*3/uL — ABNORMAL HIGH (ref 150–400)
RBC: 4.78 MIL/uL (ref 3.87–5.11)
RDW: 12.9 % (ref 11.5–15.5)
WBC: 9.4 10*3/uL (ref 4.0–10.5)
nRBC: 0 % (ref 0.0–0.2)

## 2021-06-14 LAB — PREGNANCY, URINE: Preg Test, Ur: NEGATIVE

## 2021-06-14 LAB — BASIC METABOLIC PANEL
Anion gap: 9 (ref 5–15)
BUN: 10 mg/dL (ref 6–20)
CO2: 21 mmol/L — ABNORMAL LOW (ref 22–32)
Calcium: 9.7 mg/dL (ref 8.9–10.3)
Chloride: 106 mmol/L (ref 98–111)
Creatinine, Ser: 0.95 mg/dL (ref 0.44–1.00)
GFR, Estimated: >60 mL/min (ref >60)
Glucose, Bld: 195 mg/dL — ABNORMAL HIGH (ref 70–99)
Potassium: 3.9 mmol/L (ref 3.5–5.1)
Sodium: 136 mmol/L (ref 135–145)

## 2021-06-14 LAB — GLUCOSE, CAPILLARY
Glucose-Capillary: 174 mg/dL — ABNORMAL HIGH (ref 70–99)
Glucose-Capillary: 152 mg/dL — ABNORMAL HIGH (ref 70–99)
Glucose-Capillary: 156 mg/dL — ABNORMAL HIGH (ref 70–99)
Glucose-Capillary: 180 mg/dL — ABNORMAL HIGH (ref 70–99)

## 2021-06-14 LAB — LIPID PANEL
Cholesterol: 203 mg/dL — ABNORMAL HIGH (ref 0–200)
HDL: 39 mg/dL — ABNORMAL LOW (ref >40)
LDL Cholesterol: 156 mg/dL — ABNORMAL HIGH (ref 0–99)
Total CHOL/HDL Ratio: 5.2 RATIO
Triglycerides: 42 mg/dL (ref <150)
VLDL: 8 mg/dL (ref 0–40)

## 2021-06-14 MED ORDER — INSULIN ASPART 100 UNIT/ML IJ SOLN
0.0000 [IU] | Freq: Three times a day (TID) | INTRAMUSCULAR | Status: DC
  Administered 2021-06-14 – 2021-06-15 (×4): 1 [IU] via SUBCUTANEOUS

## 2021-06-14 NOTE — Plan of Care (Signed)

## 2021-06-14 NOTE — Progress Notes (Signed)
Neurology progress note  S: Patient reports resolution of pain with R eye movement and slight improvement visual acuity OD  O:  Today's Vitals   06/14/21 0802 06/14/21 0900 06/14/21 1214 06/14/21 1611  BP: 122/65  125/63 (!) 120/59  Pulse: 89  78 75  Resp: 17  16 18   Temp: 98.2 F (36.8 C)  98.2 F (36.8 C) 98.4 F (36.9 C)  TempSrc: Oral  Oral Oral  SpO2: 96%  96% 96%  Height:      PainSc:  0-No pain     Body mass index is 41.97 kg/m.   Physical Exam Gen: A&Ox4, NAD HEENT: Atraumatic, normocephalic; oropharynx clear, tongue without atrophy or fasciculations. Resp: CTAB, normal work of breathing CV: RRR, extremities appear well-perfused. Abd: soft/NT/ND Extrem: Nml bulk; no cyanosis, clubbing, or edema.  Neuro: *MS: A&O x4. Follows multi-step commands.  *Speech: no dysarthria or aphasia, able to name and repeat. *CN:    I: Deferred   II,III: PERRLA with R APD, VFF by confrontation, optic discs not visualized 2/2 pupillary constriction   III,IV,VI: EOMI w/o nystagmus, no ptosis   V: Sensation intact from V1 to V3 to LT   VII: Eyelid closure was full.  Smile symmetric.   VIII: Hearing intact to voice   IX,X: Voice normal, palate elevates symmetrically    XI: SCM/trap 5/5 bilat   XII: Tongue protrudes midline, no atrophy or fasciculations  *Motor:   Normal bulk.  No tremor, rigidity or bradykinesia. No pronator drift.   Strength: Dlt Bic Tri WE WrF FgS Gr HF KnF KnE PlF DoF    Left 5 5 5 5 5 5 5 5 5 5 5 5     Right 5 5 5 5 5 5 5 5 5 5 5 5    *Sensory: Intact to light touch, pinprick, temperature vibration throughout. Symmetric. Propioception intact bilat.  No double-simultaneous extinction.  *Coordination:  Finger-to-nose, heel-to-shin, rapid alternating motions were intact. *Reflexes:  2+ and symmetric throughout without clonus; toes down-going bilat *Gait: deferred  A/P: Patient is a 21 yo woman with no significant pmhx who presented with R eye vision loss and  pain with eye movement. Exam significant for decreased visual acuity OD and R APD. MRI orbits shows contrast enhancement R optic nerve c/w optic neuritis. Etiology unclear; this would be considered a clinically-isolated syndrome. No e/o other demyelinating lesions on personal review of MR brain, c, or t spine wwo therefore she does not meet criteria for MS diagnosis at this time but she is aware ON may sometimes be a precursor to this.   - Continue IV solumedrol 1000mg  daily x5 days - Will continue to follow  , MD [facility] [phone]  If 7pm- 7am, please page neurology on call as listed in AMION.

## 2021-06-14 NOTE — Progress Notes (Signed)
Pt received from ED in stable condition. A/Ox4. Ambulates independently. Pt oriented to room. Skin check complete.

## 2021-06-14 NOTE — Progress Notes (Signed)
PROGRESS NOTE        PATIENT DETAILS Name: Breanna Dominguez Age: 21 y.o. Sex: female Date of Birth: May 20, 2001 Admit Date: 06/12/2021 Admitting Physician Norval Morton, MD PCP:[facility]  Brief Narrative: Patient is a 21 y.o. female with history of bronchial asthma-presenting with right eye blurry vision and pain.  Found to have optic neuritis.  Subjective: Right eye pain better-vision has improved but still blurry.  Objective: Vitals: Blood pressure 122/65, pulse 89, temperature 98.2 F (36.8 C), temperature source Oral, resp. rate 17, height 5\' 6"  (1.676 m), SpO2 96 %.   Exam: Gen Exam:Alert awake-not in any distress HEENT:atraumatic, normocephalic Chest: B/L clear to auscultation anteriorly CVS:S1S2 regular Abdomen:soft non tender, non distended Extremities:no edema Neurology: Non focal Skin: no rash  Pertinent Labs/Radiology: Recent Labs  Lab 06/14/21 0103  WBC 9.4  HGB 14.2  PLT 408*  NA 136  K 3.9  CREATININE 0.95     Assessment/Plan: Right optic neuritis: Visual symptoms/pain has improved-no demyelinating lesions seen on neuroimaging.  Continue steroids.  Steroid-induced hyperglycemia: A1c 5.0-continue SSI.  Morbid Obesity Estimated body mass index is 41.97 kg/m as calculated from the following:   Height as of this encounter: 5\' 6"  (1.676 m).   Weight as of 05/29/21: 117.9 kg.   Procedures: None Consults: Neuro DVT Prophylaxis: Lovenox Code Status:Full code or DNR Family Communication: Significant other at bedside  Time spent: 25- minutes-Greater than 50% of this time was spent in counseling, explanation of diagnosis, planning of further management, and coordination of care.   Disposition Plan: Status is: Inpatient  Remains inpatient appropriate because: Optic neuritis-needs IV steroids x5 days.   Diet: Diet Order             Diet regular Room service appropriate? Yes; Fluid  consistency: Thin  Diet effective now                     Antimicrobial agents: Anti-infectives (From admission, onward)    None        MEDICATIONS: Scheduled Meds:  enoxaparin (LOVENOX) injection  40 mg Subcutaneous Q24H   insulin aspart  0-6 Units Subcutaneous TID WC   pantoprazole  40 mg Oral Daily   Continuous Infusions:  methylPREDNISolone (SOLU-MEDROL) injection Stopped (06/13/21 1428)   PRN Meds:.acetaminophen **OR** acetaminophen, albuterol   I have personally reviewed following labs and imaging studies  LABORATORY DATA: CBC: Recent Labs  Lab 06/12/21 1505 06/14/21 0103  WBC 7.2 9.4  NEUTROABS 4.9  --   HGB 14.1 14.2  HCT 42.9 40.8  MCV 88.6 85.4  PLT 366 408*    Basic Metabolic Panel: Recent Labs  Lab 06/12/21 1505 06/14/21 0103  NA 136 136  K 3.8 3.9  CL 107 106  CO2 23 21*  GLUCOSE 164* 195*  BUN 9 10  CREATININE 0.90 0.95  CALCIUM 9.0 9.7    GFR: CrCl cannot be calculated (Unknown ideal weight.).  Liver Function Tests: No results for input(s): AST, ALT, ALKPHOS, BILITOT, PROT, ALBUMIN in the last 168 hours. No results for input(s): LIPASE, AMYLASE in the last 168 hours. No results for input(s): AMMONIA in the last 168 hours.  Coagulation Profile: No results for input(s): INR, PROTIME in the last 168 hours.  Cardiac Enzymes: No results for input(s): CKTOTAL, CKMB, CKMBINDEX, TROPONINI in the last 168  hours.  BNP (last 3 results) No results for input(s): PROBNP in the last 8760 hours.  Lipid Profile: Recent Labs    06/14/21 0103  CHOL 203*  HDL 39*  LDLCALC 156*  TRIG 42  CHOLHDL 5.2    Thyroid Function Tests: Recent Labs    06/13/21 1553  TSH 0.890    Anemia Panel: No results for input(s): VITAMINB12, FOLATE, FERRITIN, TIBC, IRON, RETICCTPCT in the last 72 hours.  Urine analysis:    Component Value Date/Time   BILIRUBINUR negative 12/25/2020 1409   KETONESUR negative 12/25/2020 1409   PROTEINUR =30 (A)  12/25/2020 1409   UROBILINOGEN 1.0 12/25/2020 1409   NITRITE Negative 12/25/2020 1409   LEUKOCYTESUR Negative 12/25/2020 1409    Sepsis Labs: Lactic Acid, Venous No results found for: LATICACIDVEN  MICROBIOLOGY: Recent Results (from the past 240 hour(s))  Resp Panel by RT-PCR (Flu A&B, Covid) Nasopharyngeal Swab     Status: None   Collection Time: 06/13/21 10:24 AM   Specimen: Nasopharyngeal Swab; Nasopharyngeal(NP) swabs in vial transport medium  Result Value Ref Range Status   SARS Coronavirus 2 by RT PCR NEGATIVE NEGATIVE Final    Comment: (NOTE) SARS-CoV-2 target nucleic acids are NOT DETECTED.  The SARS-CoV-2 RNA is generally detectable in upper respiratory specimens during the acute phase of infection. The lowest concentration of SARS-CoV-2 viral copies this assay can detect is 138 copies/mL. A negative result does not preclude SARS-Cov-2 infection and should not be used as the sole basis for treatment or other patient management decisions. A negative result may occur with  improper specimen collection/handling, submission of specimen other than nasopharyngeal swab, presence of viral mutation(s) within the areas targeted by this assay, and inadequate number of viral copies(<138 copies/mL). A negative result must be combined with clinical observations, patient history, and epidemiological information. The expected result is Negative.  Fact Sheet for Patients:  EntrepreneurPulse.com.au  Fact Sheet for Healthcare Providers:  IncredibleEmployment.be  This test is no t yet approved or cleared by the [place] FDA and  has been authorized for detection and/or diagnosis of SARS-CoV-2 by FDA under an Emergency Use Authorization (EUA). This EUA will remain  in effect (meaning this test can be used) for the duration of the COVID-19 declaration under Section 564(b)(1) of the Act, 21 U.S.C.section 360bbb-3(b)(1), unless the authorization is  terminated  or revoked sooner.       Influenza A by PCR NEGATIVE NEGATIVE Final   Influenza B by PCR NEGATIVE NEGATIVE Final    Comment: (NOTE) The Xpert Xpress SARS-CoV-2/FLU/RSV plus assay is intended as an aid in the diagnosis of influenza from Nasopharyngeal swab specimens and should not be used as a sole basis for treatment. Nasal washings and aspirates are unacceptable for Xpert Xpress SARS-CoV-2/FLU/RSV testing.  Fact Sheet for Patients: EntrepreneurPulse.com.au  Fact Sheet for Healthcare Providers: IncredibleEmployment.be  This test is not yet approved or cleared by the [place] FDA and has been authorized for detection and/or diagnosis of SARS-CoV-2 by FDA under an Emergency Use Authorization (EUA). This EUA will remain in effect (meaning this test can be used) for the duration of the COVID-19 declaration under Section 564(b)(1) of the Act, 21 U.S.C. section 360bbb-3(b)(1), unless the authorization is terminated or revoked.  Performed at [facility], [place] [address] [place], [place] [address]     RADIOLOGY STUDIES/RESULTS: MR Brain W and Wo Contrast  Result Date: 06/12/2021 CLINICAL DATA:  Initial evaluation for optic neuritis. EXAM: MRI HEAD AND ORBITS WITHOUT  AND WITH CONTRAST TECHNIQUE: Multiplanar, multiecho pulse sequences of the brain and surrounding structures were obtained without and with intravenous contrast. Multiplanar, multiecho pulse sequences of the orbits and surrounding structures were obtained including fat saturation techniques, before and after intravenous contrast administration. CONTRAST:  45mL GADAVIST GADOBUTROL 1 MMOL/ML IV SOLN COMPARISON:  None available. FINDINGS: MRI HEAD FINDINGS Brain: Rule cerebral volume within normal limits. No focal parenchymal signal abnormality. No abnormal foci of restricted diffusion to suggest acute or subacute ischemia. Gray-white matter differentiation maintained. No  areas of chronic cortical infarction or other insult. No evidence for acute or chronic intracranial hemorrhage. No mass lesion, midline shift or mass effect. No hydrocephalus or extra-axial fluid collection. Pituitary gland suprasellar region normal. Midline structures intact. No abnormal enhancement. Vascular: Major intracranial vascular flow voids are maintained. Skull and upper cervical spine: Craniocervical junction normal. Bone marrow signal intensity within normal limits. No scalp soft tissue abnormality. Other: Mastoid air cells are clear. MRI ORBITS FINDINGS Orbits: Mild flattening of the posterior contour of the right globe as compared to the left with bulging of the optic nerve disc. Diffuse edema with enhancement and inflammatory stranding seen about the right optic nerve, most pronounced just posterior to the right globe (series 15, image 10). Findings consistent with acute optic neuritis. No abnormality seen about the left globe for left optic nerve. Orbital apices within normal limits. No abnormality about the cavernous sinus. Optic chiasm normally situated within the suprasellar cistern without abnormality. Superior orbital veins symmetric and normal. Extra-ocular muscles within normal limits. Lacrimal glands normal. Visualized sinuses: Visualized paranasal sinuses are largely clear. Soft tissues: Unremarkable. IMPRESSION: 1. Findings consistent with acute right optic neuritis. 2. Otherwise normal MRI of the brain and orbits. Electronically Signed   By: Jeannine Boga M.D.   On: 06/12/2021 21:36   MR CERVICAL SPINE W WO CONTRAST  Result Date: 06/13/2021 CLINICAL DATA:  Optic neuritis EXAM: MRI CERVICAL AND THORACIC SPINE WITHOUT AND WITH CONTRAST TECHNIQUE: Multiplanar and multiecho pulse sequences of the cervical spine, to include the craniocervical junction and cervicothoracic junction, and the thoracic spine, were obtained without and with intravenous contrast. CONTRAST:  19mL GADAVIST  GADOBUTROL 1 MMOL/ML IV SOLN COMPARISON:  None. FINDINGS: MRI CERVICAL SPINE Motion artifact is present. Alignment: Preserved. Vertebrae: Vertebral body heights are maintained. No marrow edema. No suspicious osseous lesion. Cord: No definite abnormal signal.  No abnormal enhancement. Posterior Fossa, vertebral arteries, paraspinal tissues: Top-normal retropharyngeal lymph nodes are probably reactive. Disc levels: Intervertebral disc heights are maintained. Disc bulge with punctate right paracentral annular fissure at C4-C5 disc bulges at C5-C6 and C6-C7. No canal or foraminal stenosis at any level. MRI THORACIC SPINE Motion artifact is present. Alignment: Preserved. Vertebrae: Vertebral body heights are maintained. No marrow edema. No suspicious osseous lesion. Cord:  No definite abnormal signal.  No abnormal enhancement. Paraspinal and other soft tissues: Unremarkable. Disc levels: Intervertebral disc heights and signal are maintained. No canal or foraminal stenosis at any level. IMPRESSION: No abnormal cord signal or enhancement to suggest demyelinating disease within limitation of motion degradation. Electronically Signed   By: Macy Mis M.D.   On: 06/13/2021 13:58   MR THORACIC SPINE W WO CONTRAST  Result Date: 06/13/2021 CLINICAL DATA:  Optic neuritis EXAM: MRI CERVICAL AND THORACIC SPINE WITHOUT AND WITH CONTRAST TECHNIQUE: Multiplanar and multiecho pulse sequences of the cervical spine, to include the craniocervical junction and cervicothoracic junction, and the thoracic spine, were obtained without and with intravenous contrast. CONTRAST:  47mL  GADAVIST GADOBUTROL 1 MMOL/ML IV SOLN COMPARISON:  None. FINDINGS: MRI CERVICAL SPINE Motion artifact is present. Alignment: Preserved. Vertebrae: Vertebral body heights are maintained. No marrow edema. No suspicious osseous lesion. Cord: No definite abnormal signal.  No abnormal enhancement. Posterior Fossa, vertebral arteries, paraspinal tissues: Top-normal  retropharyngeal lymph nodes are probably reactive. Disc levels: Intervertebral disc heights are maintained. Disc bulge with punctate right paracentral annular fissure at C4-C5 disc bulges at C5-C6 and C6-C7. No canal or foraminal stenosis at any level. MRI THORACIC SPINE Motion artifact is present. Alignment: Preserved. Vertebrae: Vertebral body heights are maintained. No marrow edema. No suspicious osseous lesion. Cord:  No definite abnormal signal.  No abnormal enhancement. Paraspinal and other soft tissues: Unremarkable. Disc levels: Intervertebral disc heights and signal are maintained. No canal or foraminal stenosis at any level. IMPRESSION: No abnormal cord signal or enhancement to suggest demyelinating disease within limitation of motion degradation. Electronically Signed   By: Macy Mis M.D.   On: 06/13/2021 13:58   MR ORBITS W WO CONTRAST  Result Date: 06/12/2021 CLINICAL DATA:  Initial evaluation for optic neuritis. EXAM: MRI HEAD AND ORBITS WITHOUT AND WITH CONTRAST TECHNIQUE: Multiplanar, multiecho pulse sequences of the brain and surrounding structures were obtained without and with intravenous contrast. Multiplanar, multiecho pulse sequences of the orbits and surrounding structures were obtained including fat saturation techniques, before and after intravenous contrast administration. CONTRAST:  52mL GADAVIST GADOBUTROL 1 MMOL/ML IV SOLN COMPARISON:  None available. FINDINGS: MRI HEAD FINDINGS Brain: Rule cerebral volume within normal limits. No focal parenchymal signal abnormality. No abnormal foci of restricted diffusion to suggest acute or subacute ischemia. Gray-white matter differentiation maintained. No areas of chronic cortical infarction or other insult. No evidence for acute or chronic intracranial hemorrhage. No mass lesion, midline shift or mass effect. No hydrocephalus or extra-axial fluid collection. Pituitary gland suprasellar region normal. Midline structures intact. No abnormal  enhancement. Vascular: Major intracranial vascular flow voids are maintained. Skull and upper cervical spine: Craniocervical junction normal. Bone marrow signal intensity within normal limits. No scalp soft tissue abnormality. Other: Mastoid air cells are clear. MRI ORBITS FINDINGS Orbits: Mild flattening of the posterior contour of the right globe as compared to the left with bulging of the optic nerve disc. Diffuse edema with enhancement and inflammatory stranding seen about the right optic nerve, most pronounced just posterior to the right globe (series 15, image 10). Findings consistent with acute optic neuritis. No abnormality seen about the left globe for left optic nerve. Orbital apices within normal limits. No abnormality about the cavernous sinus. Optic chiasm normally situated within the suprasellar cistern without abnormality. Superior orbital veins symmetric and normal. Extra-ocular muscles within normal limits. Lacrimal glands normal. Visualized sinuses: Visualized paranasal sinuses are largely clear. Soft tissues: Unremarkable. IMPRESSION: 1. Findings consistent with acute right optic neuritis. 2. Otherwise normal MRI of the brain and orbits. Electronically Signed   By: Jeannine Boga M.D.   On: 06/12/2021 21:36     LOS: 1 day   Oren Binet, MD  [facility]    To contact the attending provider between 7A-7P or the covering provider during after hours 7P-7A, please log into the web site [email or web address] and access using universal [place] password for that web site. If you do not have the password, please call the hospital operator.  06/14/2021, 10:13 AM

## 2021-06-15 LAB — GLUCOSE, CAPILLARY
Glucose-Capillary: 111 mg/dL — ABNORMAL HIGH (ref 70–99)
Glucose-Capillary: 119 mg/dL — ABNORMAL HIGH (ref 70–99)
Glucose-Capillary: 136 mg/dL — ABNORMAL HIGH (ref 70–99)
Glucose-Capillary: 186 mg/dL — ABNORMAL HIGH (ref 70–99)
Glucose-Capillary: 135 mg/dL — ABNORMAL HIGH (ref 70–99)

## 2021-06-15 NOTE — Plan of Care (Signed)

## 2021-06-15 NOTE — Progress Notes (Signed)
PROGRESS NOTE        PATIENT DETAILS Name: Breanna Dominguez Age: 21 y.o. Sex: female Date of Birth: 2000-12-02 Admit Date: 06/12/2021 Admitting Physician Clydie Braun, MD PCP:Inc, [facility]  Brief Narrative: Patient is a 21 y.o. female with history of bronchial asthma-presenting with right eye blurry vision and pain.  Found to have optic neuritis.  Subjective: Vision in the right eye is better.  Right eye pain has resolved.  Objective: Vitals: Blood pressure 124/71, pulse 67, temperature 97.8 F (36.6 C), temperature source Oral, resp. rate 18, height 5\' 6"  (1.676 m), SpO2 96 %.   Exam: Gen Exam:Alert awake-not in any distress HEENT:atraumatic, normocephalic Chest: B/L clear to auscultation anteriorly CVS:S1S2 regular Abdomen:soft non tender, non distended Extremities:no edema Neurology: Non focal Skin: no rash   Pertinent Labs/Radiology: Recent Labs  Lab 06/14/21 0103  WBC 9.4  HGB 14.2  PLT 408*  NA 136  K 3.9  CREATININE 0.95      Assessment/Plan: Right optic neuritis: Visual symptoms/pain has improved-no demyelinating lesions seen on neuroimaging.  Continue steroids x5 days total.  Steroid-induced hyperglycemia: A1c 5.0-continue SSI.  Morbid Obesity Estimated body mass index is 41.97 kg/m as calculated from the following:   Height as of this encounter: 5\' 6"  (1.676 m).   Weight as of 05/29/21: 117.9 kg.   Procedures: None Consults: Neuro DVT Prophylaxis: Lovenox Code Status:Full code or DNR Family Communication: Significant other at bedside  Time spent: 15- minutes-Greater than 50% of this time was spent in counseling, explanation of diagnosis, planning of further management, and coordination of care.   Disposition Plan: Status is: Inpatient  Remains inpatient appropriate because: Optic neuritis-needs IV steroids x5 days.   Diet: Diet Order             Diet regular Room service  appropriate? Yes; Fluid consistency: Thin  Diet effective now                     Antimicrobial agents: Anti-infectives (From admission, onward)    None        MEDICATIONS: Scheduled Meds:  enoxaparin (LOVENOX) injection  40 mg Subcutaneous Q24H   insulin aspart  0-6 Units Subcutaneous TID WC   pantoprazole  40 mg Oral Daily   Continuous Infusions:  methylPREDNISolone (SOLU-MEDROL) injection 1,000 mg (06/15/21 0950)   PRN Meds:.acetaminophen **OR** acetaminophen, albuterol   I have personally reviewed following labs and imaging studies  LABORATORY DATA: CBC: Recent Labs  Lab 06/12/21 1505 06/14/21 0103  WBC 7.2 9.4  NEUTROABS 4.9  --   HGB 14.1 14.2  HCT 42.9 40.8  MCV 88.6 85.4  PLT 366 408*     Basic Metabolic Panel: Recent Labs  Lab 06/12/21 1505 06/14/21 0103  NA 136 136  K 3.8 3.9  CL 107 106  CO2 23 21*  GLUCOSE 164* 195*  BUN 9 10  CREATININE 0.90 0.95  CALCIUM 9.0 9.7     GFR: CrCl cannot be calculated (Unknown ideal weight.).  Liver Function Tests: No results for input(s): AST, ALT, ALKPHOS, BILITOT, PROT, ALBUMIN in the last 168 hours. No results for input(s): LIPASE, AMYLASE in the last 168 hours. No results for input(s): AMMONIA in the last 168 hours.  Coagulation Profile: No results for input(s): INR, PROTIME in the last 168 hours.  Cardiac Enzymes:  No results for input(s): CKTOTAL, CKMB, CKMBINDEX, TROPONINI in the last 168 hours.  BNP (last 3 results) No results for input(s): PROBNP in the last 8760 hours.  Lipid Profile: Recent Labs    06/14/21 0103  CHOL 203*  HDL 39*  LDLCALC 156*  TRIG 42  CHOLHDL 5.2     Thyroid Function Tests: Recent Labs    06/13/21 1553  TSH 0.890     Anemia Panel: No results for input(s): VITAMINB12, FOLATE, FERRITIN, TIBC, IRON, RETICCTPCT in the last 72 hours.  Urine analysis:    Component Value Date/Time   BILIRUBINUR negative 12/25/2020 1409   KETONESUR negative  12/25/2020 1409   PROTEINUR =30 (A) 12/25/2020 1409   UROBILINOGEN 1.0 12/25/2020 1409   NITRITE Negative 12/25/2020 1409   LEUKOCYTESUR Negative 12/25/2020 1409    Sepsis Labs: Lactic Acid, Venous No results found for: LATICACIDVEN  MICROBIOLOGY: Recent Results (from the past 240 hour(s))  Resp Panel by RT-PCR (Flu A&B, Covid) Nasopharyngeal Swab     Status: None   Collection Time: 06/13/21 10:24 AM   Specimen: Nasopharyngeal Swab; Nasopharyngeal(NP) swabs in vial transport medium  Result Value Ref Range Status   SARS Coronavirus 2 by RT PCR NEGATIVE NEGATIVE Final    Comment: (NOTE) SARS-CoV-2 target nucleic acids are NOT DETECTED.  The SARS-CoV-2 RNA is generally detectable in upper respiratory specimens during the acute phase of infection. The lowest concentration of SARS-CoV-2 viral copies this assay can detect is 138 copies/mL. A negative result does not preclude SARS-Cov-2 infection and should not be used as the sole basis for treatment or other patient management decisions. A negative result may occur with  improper specimen collection/handling, submission of specimen other than nasopharyngeal swab, presence of viral mutation(s) within the areas targeted by this assay, and inadequate number of viral copies(<138 copies/mL). A negative result must be combined with clinical observations, patient history, and epidemiological information. The expected result is Negative.  Fact Sheet for Patients:  BloggerCourse.com  Fact Sheet for Healthcare Providers:  SeriousBroker.it  This test is no t yet approved or cleared by the [place] FDA and  has been authorized for detection and/or diagnosis of SARS-CoV-2 by FDA under an Emergency Use Authorization (EUA). This EUA will remain  in effect (meaning this test can be used) for the duration of the COVID-19 declaration under Section 564(b)(1) of the Act, 21 U.S.C.section  360bbb-3(b)(1), unless the authorization is terminated  or revoked sooner.       Influenza A by PCR NEGATIVE NEGATIVE Final   Influenza B by PCR NEGATIVE NEGATIVE Final    Comment: (NOTE) The Xpert Xpress SARS-CoV-2/FLU/RSV plus assay is intended as an aid in the diagnosis of influenza from Nasopharyngeal swab specimens and should not be used as a sole basis for treatment. Nasal washings and aspirates are unacceptable for Xpert Xpress SARS-CoV-2/FLU/RSV testing.  Fact Sheet for Patients: BloggerCourse.com  Fact Sheet for Healthcare Providers: SeriousBroker.it  This test is not yet approved or cleared by the [place] FDA and has been authorized for detection and/or diagnosis of SARS-CoV-2 by FDA under an Emergency Use Authorization (EUA). This EUA will remain in effect (meaning this test can be used) for the duration of the COVID-19 declaration under Section 564(b)(1) of the Act, 21 U.S.C. section 360bbb-3(b)(1), unless the authorization is terminated or revoked.  Performed at [facility], [address], [place], [place] [address]     RADIOLOGY STUDIES/RESULTS: MR CERVICAL SPINE W WO CONTRAST  Result Date: 06/13/2021  CLINICAL DATA:  Optic neuritis EXAM: MRI CERVICAL AND THORACIC SPINE WITHOUT AND WITH CONTRAST TECHNIQUE: Multiplanar and multiecho pulse sequences of the cervical spine, to include the craniocervical junction and cervicothoracic junction, and the thoracic spine, were obtained without and with intravenous contrast. CONTRAST:  4mL GADAVIST GADOBUTROL 1 MMOL/ML IV SOLN COMPARISON:  None. FINDINGS: MRI CERVICAL SPINE Motion artifact is present. Alignment: Preserved. Vertebrae: Vertebral body heights are maintained. No marrow edema. No suspicious osseous lesion. Cord: No definite abnormal signal.  No abnormal enhancement. Posterior Fossa, vertebral arteries, paraspinal tissues: Top-normal retropharyngeal lymph  nodes are probably reactive. Disc levels: Intervertebral disc heights are maintained. Disc bulge with punctate right paracentral annular fissure at C4-C5 disc bulges at C5-C6 and C6-C7. No canal or foraminal stenosis at any level. MRI THORACIC SPINE Motion artifact is present. Alignment: Preserved. Vertebrae: Vertebral body heights are maintained. No marrow edema. No suspicious osseous lesion. Cord:  No definite abnormal signal.  No abnormal enhancement. Paraspinal and other soft tissues: Unremarkable. Disc levels: Intervertebral disc heights and signal are maintained. No canal or foraminal stenosis at any level. IMPRESSION: No abnormal cord signal or enhancement to suggest demyelinating disease within limitation of motion degradation. Electronically Signed   By: Guadlupe Spanish M.D.   On: 06/13/2021 13:58   MR THORACIC SPINE W WO CONTRAST  Result Date: 06/13/2021 CLINICAL DATA:  Optic neuritis EXAM: MRI CERVICAL AND THORACIC SPINE WITHOUT AND WITH CONTRAST TECHNIQUE: Multiplanar and multiecho pulse sequences of the cervical spine, to include the craniocervical junction and cervicothoracic junction, and the thoracic spine, were obtained without and with intravenous contrast. CONTRAST:  29mL GADAVIST GADOBUTROL 1 MMOL/ML IV SOLN COMPARISON:  None. FINDINGS: MRI CERVICAL SPINE Motion artifact is present. Alignment: Preserved. Vertebrae: Vertebral body heights are maintained. No marrow edema. No suspicious osseous lesion. Cord: No definite abnormal signal.  No abnormal enhancement. Posterior Fossa, vertebral arteries, paraspinal tissues: Top-normal retropharyngeal lymph nodes are probably reactive. Disc levels: Intervertebral disc heights are maintained. Disc bulge with punctate right paracentral annular fissure at C4-C5 disc bulges at C5-C6 and C6-C7. No canal or foraminal stenosis at any level. MRI THORACIC SPINE Motion artifact is present. Alignment: Preserved. Vertebrae: Vertebral body heights are maintained. No  marrow edema. No suspicious osseous lesion. Cord:  No definite abnormal signal.  No abnormal enhancement. Paraspinal and other soft tissues: Unremarkable. Disc levels: Intervertebral disc heights and signal are maintained. No canal or foraminal stenosis at any level. IMPRESSION: No abnormal cord signal or enhancement to suggest demyelinating disease within limitation of motion degradation. Electronically Signed   By: Guadlupe Spanish M.D.   On: 06/13/2021 13:58     LOS: 2 days   Jeoffrey Massed, MD  [facility]    To contact the attending provider between 7A-7P or the covering provider during after hours 7P-7A, please log into the web site [email or web address] and access using universal [place] password for that web site. If you do not have the password, please call the hospital operator.  06/15/2021, 10:37 AM

## 2021-06-15 NOTE — Care Management (Signed)
°  Transition of Care [facility]) Screening Note   Patient Details  Name: Breanna Dominguez Date of Birth: [birth year]   Transition of Care [facility]) CM/SW Contact:    Lawerance Sabal, RN Phone Number: 06/15/2021, 5:04 PM    Transition of Care Department [facility]) has reviewed patient and no TOC needs have been identified at this time. We will continue to monitor patient advancement through interdisciplinary progression rounds. If new patient transition needs arise, please place a TOC consult.

## 2021-06-16 LAB — GLUCOSE, CAPILLARY
Glucose-Capillary: 86 mg/dL (ref 70–99)
Glucose-Capillary: 91 mg/dL (ref 70–99)
Glucose-Capillary: 148 mg/dL — ABNORMAL HIGH (ref 70–99)
Glucose-Capillary: 176 mg/dL — ABNORMAL HIGH (ref 70–99)

## 2021-06-16 NOTE — Progress Notes (Addendum)
Neurology progress note  S: Patient reports resolution of pain with R eye movement and continued improvement visual acuity OD  O:  Today's Vitals   06/15/21 2006 06/15/21 2007 06/15/21 2324 06/16/21 0513  BP: (!) 151/85 (!) 151/85  122/77  Pulse: 82  67 83  Resp: 18  18 20   Temp: 98.9 F (37.2 C)  98.4 F (36.9 C) 98.6 F (37 C)  TempSrc: Oral  Oral Oral  SpO2: 100%  97% 97%  Height:      PainSc:   0-No pain    Body mass index is 41.97 kg/m.   Physical Exam Gen: A&Ox4, NAD HEENT: Atraumatic, normocephalic; oropharynx clear, tongue without atrophy or fasciculations. Resp: CTAB, normal work of breathing CV: RRR, extremities appear well-perfused. Abd: soft/NT/ND Extrem: Nml bulk; no cyanosis, clubbing, or edema.  Neuro: *MS: A&O x4. Follows multi-step commands.  *Speech: no dysarthria or aphasia, able to name and repeat. *CN:    I: Deferred   II,III: PERRLA with R APD, VFF by confrontation, optic discs not visualized 2/2 pupillary constriction   III,IV,VI: EOMI w/o nystagmus, no ptosis   V: Sensation intact from V1 to V3 to LT   VII: Eyelid closure was full.  Smile symmetric.   VIII: Hearing intact to voice   IX,X: Voice normal, palate elevates symmetrically    XI: SCM/trap 5/5 bilat   XII: Tongue protrudes midline, no atrophy or fasciculations  *Motor:   Normal bulk.  No tremor, rigidity or bradykinesia. No pronator drift.   Strength: Dlt Bic Tri WE WrF FgS Gr HF KnF KnE PlF DoF    Left 5 5 5 5 5 5 5 5 5 5 5 5     Right 5 5 5 5 5 5 5 5 5 5 5 5    *Sensory: Intact to light touch, pinprick, temperature vibration throughout. Symmetric. Propioception intact bilat.  No double-simultaneous extinction.  *Coordination:  Finger-to-nose, heel-to-shin, rapid alternating motions were intact. *Reflexes:  2+ and symmetric throughout without clonus; toes down-going bilat *Gait: deferred  A/P: Patient is a 21 yo woman with no significant pmhx who presented with R eye vision loss  and pain with eye movement. Exam significant for decreased visual acuity OD and R APD. MRI orbits shows contrast enhancement R optic nerve c/w optic neuritis. No e/o other demyelinating lesions on personal review of MR brain, c, or t spine wwo therefore she does not meet criteria for MS diagnosis at this time but she is aware ON may sometimes be a precursor to this.   - Continue IV solumedrol 1000mg  daily x5 days - Will continue to follow  , MD [facility] [phone]  If 7pm- 7am, please page neurology on call as listed in AMION.

## 2021-06-16 NOTE — Progress Notes (Signed)
PROGRESS NOTE        PATIENT DETAILS Name: Breanna Dominguez Age: 21 y.o. Sex: female Date of Birth: Nov 09, 2000 Admit Date: 06/12/2021 Admitting Physician Clydie Braun, MD PCP:[facility], [facility]  Brief Narrative: Patient is a 21 y.o. female with history of bronchial asthma-presenting with right eye blurry vision and pain.  Found to have optic neuritis.  Subjective: Visual issues in the right eye is much better.  Lying comfortably in bed.  Objective: Vitals: Blood pressure 120/71, pulse 69, temperature 98 F (36.7 C), temperature source Oral, resp. rate 16, height 5\' 6"  (1.676 m), SpO2 98 %.   Exam: Gen Exam:Alert awake-not in any distress HEENT:atraumatic, normocephalic Chest: B/L clear to auscultation anteriorly CVS:S1S2 regular Abdomen:soft non tender, non distended Extremities:no edema Neurology: Non focal Skin: no rash   Pertinent Labs/Radiology: Recent Labs  Lab 06/14/21 0103  WBC 9.4  HGB 14.2  PLT 408*  NA 136  K 3.9  CREATININE 0.95      Assessment/Plan: Right optic neuritis: Visual symptoms/pain has improved-no demyelinating lesions seen on neuroimaging.  Continue steroids x5 days total (completes on 1/10)  Steroid-induced hyperglycemia: A1c 5.0-continue SSI.  Recent Labs    06/15/21 2010 06/16/21 0738 06/16/21 1143  GLUCAP 135* 86 91     Morbid Obesity Estimated body mass index is 41.97 kg/m as calculated from the following:   Height as of this encounter: 5\' 6"  (1.676 m).   Weight as of 05/29/21: 117.9 kg.   Procedures: None Consults: Neuro DVT Prophylaxis: Lovenox Code Status:Full code or DNR Family Communication: Significant other at bedside  Time spent: 15- minutes-Greater than 50% of this time was spent in counseling, explanation of diagnosis, planning of further management, and coordination of care.   Disposition Plan: Status is: Inpatient  Remains inpatient appropriate because:  Optic neuritis-needs IV steroids x5 days.   Diet: Diet Order             Diet regular Room service appropriate? Yes; Fluid consistency: Thin  Diet effective now                     Antimicrobial agents: Anti-infectives (From admission, onward)    None        MEDICATIONS: Scheduled Meds:  enoxaparin (LOVENOX) injection  40 mg Subcutaneous Q24H   insulin aspart  0-6 Units Subcutaneous TID WC   pantoprazole  40 mg Oral Daily   Continuous Infusions:  methylPREDNISolone (SOLU-MEDROL) injection 1,000 mg (06/16/21 0930)   PRN Meds:.acetaminophen **OR** acetaminophen, albuterol   I have personally reviewed following labs and imaging studies  LABORATORY DATA: CBC: Recent Labs  Lab 06/12/21 1505 06/14/21 0103  WBC 7.2 9.4  NEUTROABS 4.9  --   HGB 14.1 14.2  HCT 42.9 40.8  MCV 88.6 85.4  PLT 366 408*     Basic Metabolic Panel: Recent Labs  Lab 06/12/21 1505 06/14/21 0103  NA 136 136  K 3.8 3.9  CL 107 106  CO2 23 21*  GLUCOSE 164* 195*  BUN 9 10  CREATININE 0.90 0.95  CALCIUM 9.0 9.7     GFR: CrCl cannot be calculated (Unknown ideal weight.).  Liver Function Tests: No results for input(s): AST, ALT, ALKPHOS, BILITOT, PROT, ALBUMIN in the last 168 hours. No results for input(s): LIPASE, AMYLASE in the last 168 hours. No results for  input(s): AMMONIA in the last 168 hours.  Coagulation Profile: No results for input(s): INR, PROTIME in the last 168 hours.  Cardiac Enzymes: No results for input(s): CKTOTAL, CKMB, CKMBINDEX, TROPONINI in the last 168 hours.  BNP (last 3 results) No results for input(s): PROBNP in the last 8760 hours.  Lipid Profile: Recent Labs    06/14/21 0103  CHOL 203*  HDL 39*  LDLCALC 156*  TRIG 42  CHOLHDL 5.2     Thyroid Function Tests: Recent Labs    06/13/21 1553  TSH 0.890     Anemia Panel: No results for input(s): VITAMINB12, FOLATE, FERRITIN, TIBC, IRON, RETICCTPCT in the last 72 hours.  Urine  analysis:    Component Value Date/Time   BILIRUBINUR negative 12/25/2020 1409   KETONESUR negative 12/25/2020 1409   PROTEINUR =30 (A) 12/25/2020 1409   UROBILINOGEN 1.0 12/25/2020 1409   NITRITE Negative 12/25/2020 1409   LEUKOCYTESUR Negative 12/25/2020 1409    Sepsis Labs: Lactic Acid, Venous No results found for: LATICACIDVEN  MICROBIOLOGY: Recent Results (from the past 240 hour(s))  Resp Panel by RT-PCR (Flu A&B, Covid) Nasopharyngeal Swab     Status: None   Collection Time: 06/13/21 10:24 AM   Specimen: Nasopharyngeal Swab; Nasopharyngeal(NP) swabs in vial transport medium  Result Value Ref Range Status   SARS Coronavirus 2 by RT PCR NEGATIVE NEGATIVE Final    Comment: (NOTE) SARS-CoV-2 target nucleic acids are NOT DETECTED.  The SARS-CoV-2 RNA is generally detectable in upper respiratory specimens during the acute phase of infection. The lowest concentration of SARS-CoV-2 viral copies this assay can detect is 138 copies/mL. A negative result does not preclude SARS-Cov-2 infection and should not be used as the sole basis for treatment or other patient management decisions. A negative result may occur with  improper specimen collection/handling, submission of specimen other than nasopharyngeal swab, presence of viral mutation(s) within the areas targeted by this assay, and inadequate number of viral copies(<138 copies/mL). A negative result must be combined with clinical observations, patient history, and epidemiological information. The expected result is Negative.  Fact Sheet for Patients:  BloggerCourse.com  Fact Sheet for Healthcare Providers:  SeriousBroker.it  This test is no t yet approved or cleared by the [place] FDA and  has been authorized for detection and/or diagnosis of SARS-CoV-2 by FDA under an Emergency Use Authorization (EUA). This EUA will remain  in effect (meaning this test can be used) for  the duration of the COVID-19 declaration under Section 564(b)(1) of the Act, 21 U.S.C.section 360bbb-3(b)(1), unless the authorization is terminated  or revoked sooner.       Influenza A by PCR NEGATIVE NEGATIVE Final   Influenza B by PCR NEGATIVE NEGATIVE Final    Comment: (NOTE) The Xpert Xpress SARS-CoV-2/FLU/RSV plus assay is intended as an aid in the diagnosis of influenza from Nasopharyngeal swab specimens and should not be used as a sole basis for treatment. Nasal washings and aspirates are unacceptable for Xpert Xpress SARS-CoV-2/FLU/RSV testing.  Fact Sheet for Patients: BloggerCourse.com  Fact Sheet for Healthcare Providers: SeriousBroker.it  This test is not yet approved or cleared by the [place] FDA and has been authorized for detection and/or diagnosis of SARS-CoV-2 by FDA under an Emergency Use Authorization (EUA). This EUA will remain in effect (meaning this test can be used) for the duration of the COVID-19 declaration under Section 564(b)(1) of the Act, 21 U.S.C. section 360bbb-3(b)(1), unless the authorization is terminated or revoked.  Performed at [facility], [address] [place], [place] [address]     RADIOLOGY STUDIES/RESULTS: No results found.   LOS: 3 days   Jeoffrey Massed, MD  [facility]    To contact the attending provider between 7A-7P or the covering provider during after hours 7P-7A, please log into the web site [email or web address] and access using universal  password for that web site. If you do not have the password, please call the hospital operator.  06/16/2021, 12:39 PM

## 2021-06-16 NOTE — Consult Note (Addendum)
Neurology Progress Note  Brief HPI: Breanna Dominguez is a 21 y.o. female with no significant PMHx but a recent illness in December 2022 who presented from her ophthalmologist's office after a [facility] visit and consulted to neurology for evaluation of optic neuritis.  Subjective: Patient reports resolution of her OD pain, and notes continued improvements of her visual acuity, with some residual blurring.   Exam: Vitals:   06/16/21 0513 06/16/21 0743  BP: 122/77 112/69  Pulse: 83 73  Resp: 20 16  Temp: 98.6 F (37 C) 98 F (36.7 C)  SpO2: 97% 99%     Physical Exam  Constitutional: Appears well-developed and well-nourished. In bed, NAD  Psych: Affect appropriate to situation Eyes: No scleral injection HENT: No OP obstruction MSK: no joint deformities.  Cardiovascular: Normal rate and regular rhythm on telemetry.  Respiratory: Effort normal, non-labored breathing GI: Soft.  No distension. There is no tenderness.  Skin: WDI  Neuro: Mental Status: Patient is awake, alert, oriented to person, place, month, year, and situation. Patient is able to give a clear and coherent history. No signs of aphasia or neglect Cranial Nerves: II: Visual Fields are full. Pupils are round, with no RAPD. Vision 20/25 bilaterally on card, subjectively with some residual blurring on the right.  III,IV, VI: EOMI without ptosis, diplopia, nor pain.  V: Facial sensation is symmetric to temperature VII: Facial movement is symmetric resting and smiling VIII: Hearing is intact to voice X: Palate elevates symmetrically XI: Shoulder shrug is symmetric. XII: Tongue protrudes midline without atrophy or fasciculations.  Motor: Tone is normal. Bulk is normal. 5/5 strength was present in all four extremities.  Sensory: Sensation is symmetric to light touch and temperature in the arms and legs. No extinction to DSS present.  Deep Tendon Reflexes: Deferred Cerebellar: Deferred   Pertinent Labs: No  new labs to review.  Imaging Reviewed: No new imaging to review. See MR Brain, MR Orbits, MR Cervical and Thoracic Spine for previous imaging.   Assessment: 21 year old female presenting with acute right optic neuritis.  - Patient has had significant improvements on Solumedrol therapy, with resolution of OD pain and improvements to vision. - No evidence for MS on MRI of brain and spine.  - She requires continued admission for one more day for completion of IV Solumedrol, after which she will be assessed by neurology.   Recommendations: 1) Continue Solumedrol 1000 mg IV x 5 days (ends 1/10) for optic neuritis 2) Pending continued clinical improvement, patient will be cleared for discharge after completion of Solumedrol. Neurology will assess and give final  recommendations 1/10.  3) Will need outpatient follow up with Dr. Epimenio Foot of [facility].   Lamar Sprinkles, MD PGY-1 06/16/2021  Electronically signed: Dr. Caryl Pina

## 2021-06-17 LAB — GLUCOSE, CAPILLARY: Glucose-Capillary: 108 mg/dL — ABNORMAL HIGH (ref 70–99)

## 2021-06-17 NOTE — Progress Notes (Signed)
Discharge instructions (including medications) discussed with and copy provided to patient/caregiver 

## 2021-06-17 NOTE — Final Consult Note (Addendum)
Neurology Progress Note  Brief HPI: Breanna Dominguez is a 21 y.o. female with no significant PMHx but a recent illness in December 2022 who presented from her ophthalmologist's office after a [facility] visit and consulted to neurology for evaluation of optic neuritis.  Subjective: Patient reports resolution of her OD pain today, and notes continued improvements of her visual acuity, with restored visual acuity.   Exam: Vitals:   06/16/21 2101 06/17/21 0747  BP: 130/73 123/69  Pulse: 78 65  Resp: 18 17  Temp: 98.1 F (36.7 C) 98.3 F (36.8 C)  SpO2: 100% 100%   Physical Exam  Constitutional: Appears well-developed and well-nourished. In bed, NAD  Psych: Affect appropriate to situation Eyes: No scleral injection HENT: No OP obstruction MSK: no joint deformities.  Cardiovascular: Normal rate and regular rhythm on telemetry.  Respiratory: Effort normal, non-labored breathing GI: Soft.  No distension. There is no tenderness.  Skin: WDI   Neuro: Mental Status: Patient is awake, alert, oriented to person, place, month, year, and situation. Patient is able to give a clear and coherent history. No signs of aphasia or neglect Cranial Nerves: II: Visual Fields are full. Pupils are round, with RAPD. Vision 20/10 bilaterally on card, subjectively without blurring on the right.  III,IV, VI: EOMI without ptosis, diplopia, nor pain.  V: Facial sensation is symmetric to temperature VII: Facial movement is symmetric resting and smiling VIII: Hearing is intact to voice X: Palate elevates symmetrically XI: Shoulder shrug is symmetric. XII: Tongue protrudes midline without atrophy or fasciculations.  Motor: Tone is normal. Bulk is normal. 5/5 strength was present in all four extremities.  Sensory: Sensation is symmetric to light touch and temperature in the arms and legs. No extinction to DSS present.  Deep Tendon Reflexes: Deferred Cerebellar: Deferred  Pertinent Labs: No new  labs  Imaging Reviewed: No new imaging  Assessment: 21 year old female presenting with acute right optic neuritis.  - Patient has had significant improvements on 5 days of Solumedrol therapy, with resolution of OD pain and blurred vision.  - No evidence of MS on MRI of brain and spine.    Recommendations: 1) Patient completes steroid therapy today, and upon completion is deemed stable for discharge from a neurologic standpoint.  2) Will need outpatient follow up with Dr. Epimenio Foot of [facility].  3) At this time, neurology will sign-off. Should any further concerns arise, please do not hesitate to reach out to our team. Thank you for this interesting consult.  Breanna Sprinkles, MD PGY-1 06/17/2021  Electronically signed: Dr. Caryl Dominguez

## 2021-06-17 NOTE — Discharge Summary (Signed)
PATIENT DETAILS Name: Breanna Dominguez Age: 21 y.o. Sex: female Date of Birth: 2000/11/08 MRN: [id number]. Admitting Physician: Norval Morton, MD PCP:[facility]  Admit Date: 06/12/2021 Discharge date: 06/17/2021  Recommendations for Outpatient Follow-up:  Follow up with PCP in 1-2 weeks Please obtain CMP/CBC in one week Please ensure follow-up with neurology  Admitted From:  Home  Disposition: [place]: No  Equipment/Devices: None  Discharge Condition: Stable  CODE STATUS: FULL CODE  Diet recommendation:  Diet Order             Diet general           Diet regular Room service appropriate? Yes; Fluid consistency: Thin  Diet effective now                    Brief Summary: Patient is a 21 y.o. female with history of bronchial asthma-presenting with right eye blurry vision and pain.  Found to have optic neuritis.  Brief Hospital Course: Right optic neuritis: Significantly better-visual symptoms are much better-hardly any eye pain anymore.  Treated with steroids x5 days.  No evidence of MS on MRI of brain/spine.  Discussed with neurology-no further inpatient recommendations-patient will need outpatient follow-up with neurology.    Steroid-induced hyperglycemia: A1c 5.0-continue SSI.  Obesity: Estimated body mass index is 41.97 kg/m as calculated from the following:   Height as of this encounter: 5\' 6"  (1.676 m).   Weight as of 05/29/21: 117.9 kg.    Procedures None  Discharge Diagnoses:  Principal Problem:   Optic neuritis Active Problems:   Obesity, Class III, BMI 40-49.9 (morbid obesity) (HCC)   Hyperglycemia   Discharge Instructions:  Activity:  As tolerated   Discharge Instructions     Ambulatory referral to Neurology   Complete by: As directed    An appointment is requested in approximately: 4 weeks   Diet general   Complete by: As directed    Discharge instructions   Complete by: As  directed    Follow with Primary MD  [facility] in 1-2 weeks  Please follow-up with [place] neurology-Dr. Duncan Dull will get a call from the office, if you do not hear from them-please give them a call.  Please get a complete blood count and chemistry panel checked by your Primary MD at your next visit, and again as instructed by your Primary MD.  Get Medicines reviewed and adjusted: Please take all your medications with you for your next visit with your Primary MD  Laboratory/radiological data: Please request your Primary MD to go over all hospital tests and procedure/radiological results at the follow up, please ask your Primary MD to get all Hospital records sent to his/her office.  In some cases, they will be blood work, cultures and biopsy results pending at the time of your discharge. Please request that your primary care M.D. follows up on these results.  Also Note the following: If you experience worsening of your admission symptoms, develop shortness of breath, life threatening emergency, suicidal or homicidal thoughts you must seek medical attention immediately by calling 911 or calling your MD immediately  if symptoms less severe.  You must read complete instructions/literature along with all the possible adverse reactions/side effects for all the Medicines you take and that have been prescribed to you. Take any new Medicines after you have completely understood and accpet all the possible adverse reactions/side effects.   Do not drive when  taking Pain medications or sleeping medications (Benzodaizepines)  Do not take more than prescribed Pain, Sleep and Anxiety Medications. It is not advisable to combine anxiety,sleep and pain medications without talking with your primary care practitioner  Special Instructions: If you have smoked or chewed Tobacco  in the last 2 yrs please stop smoking, stop any regular Alcohol  and or any Recreational drug  use.  Wear Seat belts while driving.  Please note: You were cared for by a hospitalist during your hospital stay. Once you are discharged, your primary care physician will handle any further medical issues. Please note that NO REFILLS for any discharge medications will be authorized once you are discharged, as it is imperative that you return to your primary care physician (or establish a relationship with a primary care physician if you do not have one) for your post hospital discharge needs so that they can reassess your need for medications and monitor your lab values.   Increase activity slowly   Complete by: As directed       Allergies as of 06/17/2021       Reactions   Apple    Itchy throat   Banana    Itchy throat   Shrimp [shellfish Allergy]    Itchy throat        Medication List     STOP taking these medications    albuterol 108 (90 Base) MCG/ACT inhaler Commonly known as: VENTOLIN HFA   cetirizine 10 MG tablet Commonly known as: ZYRTEC   gabapentin 300 MG capsule Commonly known as: NEURONTIN   ketorolac 0.5 % ophthalmic solution Commonly known as: ACULAR   medroxyPROGESTERone 150 MG/ML injection Commonly known as: DEPO-PROVERA   meloxicam 15 MG tablet Commonly known as: MOBIC   predniSONE 20 MG tablet Commonly known as: DELTASONE   promethazine-dextromethorphan 6.25-15 MG/5ML syrup Commonly known as: PROMETHAZINE-DM   triamcinolone 0.025 % ointment Commonly known as: KENALOG   trimethoprim-polymyxin b ophthalmic solution Commonly known as: [place], [facility]. Schedule an appointment as soon as possible for a visit in 1 week(s).   Specialty: Pediatrics Contact information: [place] [address] [id number]         Britt Bottom, MD Follow up.   Specialty: Neurology Why: Office will call with date/time, If you dont hear from them,please give them a  call Contact information: [place] [address] [phone]                Allergies  Allergen Reactions   Apple     Itchy throat    Banana     Itchy throat   Shrimp [Shellfish Allergy]     Itchy throat      Consultations:  neurology   Other Procedures/Studies: MR Brain W and Wo Contrast  Result Date: 06/12/2021 CLINICAL DATA:  Initial evaluation for optic neuritis. EXAM: MRI HEAD AND ORBITS WITHOUT AND WITH CONTRAST TECHNIQUE: Multiplanar, multiecho pulse sequences of the brain and surrounding structures were obtained without and with intravenous contrast. Multiplanar, multiecho pulse sequences of the orbits and surrounding structures were obtained including fat saturation techniques, before and after intravenous contrast administration. CONTRAST:  80mL GADAVIST GADOBUTROL 1 MMOL/ML IV SOLN COMPARISON:  None available. FINDINGS: MRI HEAD FINDINGS Brain: Rule cerebral volume within normal limits. No focal parenchymal signal abnormality. No abnormal foci of restricted diffusion to suggest acute or subacute ischemia. Gray-white  matter differentiation maintained. No areas of chronic cortical infarction or other insult. No evidence for acute or chronic intracranial hemorrhage. No mass lesion, midline shift or mass effect. No hydrocephalus or extra-axial fluid collection. Pituitary gland suprasellar region normal. Midline structures intact. No abnormal enhancement. Vascular: Major intracranial vascular flow voids are maintained. Skull and upper cervical spine: Craniocervical junction normal. Bone marrow signal intensity within normal limits. No scalp soft tissue abnormality. Other: Mastoid air cells are clear. MRI ORBITS FINDINGS Orbits: Mild flattening of the posterior contour of the right globe as compared to the left with bulging of the optic nerve disc. Diffuse edema with enhancement and inflammatory stranding seen about the right optic nerve, most pronounced just  posterior to the right globe (series 15, image 10). Findings consistent with acute optic neuritis. No abnormality seen about the left globe for left optic nerve. Orbital apices within normal limits. No abnormality about the cavernous sinus. Optic chiasm normally situated within the suprasellar cistern without abnormality. Superior orbital veins symmetric and normal. Extra-ocular muscles within normal limits. Lacrimal glands normal. Visualized sinuses: Visualized paranasal sinuses are largely clear. Soft tissues: Unremarkable. IMPRESSION: 1. Findings consistent with acute right optic neuritis. 2. Otherwise normal MRI of the brain and orbits. Electronically Signed   By: Jeannine Boga M.D.   On: 06/12/2021 21:36   MR CERVICAL SPINE W WO CONTRAST  Result Date: 06/13/2021 CLINICAL DATA:  Optic neuritis EXAM: MRI CERVICAL AND THORACIC SPINE WITHOUT AND WITH CONTRAST TECHNIQUE: Multiplanar and multiecho pulse sequences of the cervical spine, to include the craniocervical junction and cervicothoracic junction, and the thoracic spine, were obtained without and with intravenous contrast. CONTRAST:  6mL GADAVIST GADOBUTROL 1 MMOL/ML IV SOLN COMPARISON:  None. FINDINGS: MRI CERVICAL SPINE Motion artifact is present. Alignment: Preserved. Vertebrae: Vertebral body heights are maintained. No marrow edema. No suspicious osseous lesion. Cord: No definite abnormal signal.  No abnormal enhancement. Posterior Fossa, vertebral arteries, paraspinal tissues: Top-normal retropharyngeal lymph nodes are probably reactive. Disc levels: Intervertebral disc heights are maintained. Disc bulge with punctate right paracentral annular fissure at C4-C5 disc bulges at C5-C6 and C6-C7. No canal or foraminal stenosis at any level. MRI THORACIC SPINE Motion artifact is present. Alignment: Preserved. Vertebrae: Vertebral body heights are maintained. No marrow edema. No suspicious osseous lesion. Cord:  No definite abnormal signal.  No  abnormal enhancement. Paraspinal and other soft tissues: Unremarkable. Disc levels: Intervertebral disc heights and signal are maintained. No canal or foraminal stenosis at any level. IMPRESSION: No abnormal cord signal or enhancement to suggest demyelinating disease within limitation of motion degradation. Electronically Signed   By: Macy Mis M.D.   On: 06/13/2021 13:58   MR THORACIC SPINE W WO CONTRAST  Result Date: 06/13/2021 CLINICAL DATA:  Optic neuritis EXAM: MRI CERVICAL AND THORACIC SPINE WITHOUT AND WITH CONTRAST TECHNIQUE: Multiplanar and multiecho pulse sequences of the cervical spine, to include the craniocervical junction and cervicothoracic junction, and the thoracic spine, were obtained without and with intravenous contrast. CONTRAST:  69mL GADAVIST GADOBUTROL 1 MMOL/ML IV SOLN COMPARISON:  None. FINDINGS: MRI CERVICAL SPINE Motion artifact is present. Alignment: Preserved. Vertebrae: Vertebral body heights are maintained. No marrow edema. No suspicious osseous lesion. Cord: No definite abnormal signal.  No abnormal enhancement. Posterior Fossa, vertebral arteries, paraspinal tissues: Top-normal retropharyngeal lymph nodes are probably reactive. Disc levels: Intervertebral disc heights are maintained. Disc bulge with punctate right paracentral annular fissure at C4-C5 disc bulges at C5-C6 and C6-C7. No canal or foraminal stenosis at any level.  MRI THORACIC SPINE Motion artifact is present. Alignment: Preserved. Vertebrae: Vertebral body heights are maintained. No marrow edema. No suspicious osseous lesion. Cord:  No definite abnormal signal.  No abnormal enhancement. Paraspinal and other soft tissues: Unremarkable. Disc levels: Intervertebral disc heights and signal are maintained. No canal or foraminal stenosis at any level. IMPRESSION: No abnormal cord signal or enhancement to suggest demyelinating disease within limitation of motion degradation. Electronically Signed   By: Macy Mis  M.D.   On: 06/13/2021 13:58   MR ORBITS W WO CONTRAST  Result Date: 06/12/2021 CLINICAL DATA:  Initial evaluation for optic neuritis. EXAM: MRI HEAD AND ORBITS WITHOUT AND WITH CONTRAST TECHNIQUE: Multiplanar, multiecho pulse sequences of the brain and surrounding structures were obtained without and with intravenous contrast. Multiplanar, multiecho pulse sequences of the orbits and surrounding structures were obtained including fat saturation techniques, before and after intravenous contrast administration. CONTRAST:  9mL GADAVIST GADOBUTROL 1 MMOL/ML IV SOLN COMPARISON:  None available. FINDINGS: MRI HEAD FINDINGS Brain: Rule cerebral volume within normal limits. No focal parenchymal signal abnormality. No abnormal foci of restricted diffusion to suggest acute or subacute ischemia. Gray-white matter differentiation maintained. No areas of chronic cortical infarction or other insult. No evidence for acute or chronic intracranial hemorrhage. No mass lesion, midline shift or mass effect. No hydrocephalus or extra-axial fluid collection. Pituitary gland suprasellar region normal. Midline structures intact. No abnormal enhancement. Vascular: Major intracranial vascular flow voids are maintained. Skull and upper cervical spine: Craniocervical junction normal. Bone marrow signal intensity within normal limits. No scalp soft tissue abnormality. Other: Mastoid air cells are clear. MRI ORBITS FINDINGS Orbits: Mild flattening of the posterior contour of the right globe as compared to the left with bulging of the optic nerve disc. Diffuse edema with enhancement and inflammatory stranding seen about the right optic nerve, most pronounced just posterior to the right globe (series 15, image 10). Findings consistent with acute optic neuritis. No abnormality seen about the left globe for left optic nerve. Orbital apices within normal limits. No abnormality about the cavernous sinus. Optic chiasm normally situated within the  suprasellar cistern without abnormality. Superior orbital veins symmetric and normal. Extra-ocular muscles within normal limits. Lacrimal glands normal. Visualized sinuses: Visualized paranasal sinuses are largely clear. Soft tissues: Unremarkable. IMPRESSION: 1. Findings consistent with acute right optic neuritis. 2. Otherwise normal MRI of the brain and orbits. Electronically Signed   By: Jeannine Boga M.D.   On: 06/12/2021 21:36     TODAY-DAY OF DISCHARGE:  Subjective:   Pernell Dupre today has no headache,no chest abdominal pain,no new weakness tingling or numbness, feels much better wants to go home today.  Objective:   Blood pressure 123/69, pulse 65, temperature 98.3 F (36.8 C), temperature source Oral, resp. rate 17, height 5\' 6"  (1.676 m), SpO2 100 %.  Intake/Output Summary (Last 24 hours) at 06/17/2021 0943 Last data filed at 06/16/2021 2110 Gross per 24 hour  Intake 500 ml  Output --  Net 500 ml   There were no vitals filed for this visit.  Exam: Awake Alert, Oriented *3, No new F.N deficits, Normal affect [place].AT,PERRAL Supple Neck,No JVD, No cervical lymphadenopathy appriciated.  Symmetrical Chest wall movement, Good air movement bilaterally, CTAB RRR,No Gallops,Rubs or new Murmurs, No Parasternal Heave +ve B.Sounds, Abd Soft, Non tender, No organomegaly appriciated, No rebound -guarding or rigidity. No Cyanosis, Clubbing or edema, No new Rash or bruise   PERTINENT RADIOLOGIC STUDIES: No results found.   PERTINENT LAB RESULTS: CBC: No  results for input(s): WBC, HGB, HCT, PLT in the last 72 hours. CMET CMP     Component Value Date/Time   NA 136 06/14/2021 0103   K 3.9 06/14/2021 0103   CL 106 06/14/2021 0103   CO2 21 (L) 06/14/2021 0103   GLUCOSE 195 (H) 06/14/2021 0103   BUN 10 06/14/2021 0103   CREATININE 0.95 06/14/2021 0103   CALCIUM 9.7 06/14/2021 0103   PROT 7.1 03/02/2021 1946   ALBUMIN 3.8 03/02/2021 1946   AST 20 03/02/2021 1946   ALT 20  03/02/2021 1946   ALKPHOS 63 03/02/2021 1946   BILITOT 1.2 03/02/2021 1946   GFRNONAA >60 06/14/2021 0103    GFR CrCl cannot be calculated (Unknown ideal weight.). No results for input(s): LIPASE, AMYLASE in the last 72 hours. No results for input(s): CKTOTAL, CKMB, CKMBINDEX, TROPONINI in the last 72 hours. Invalid input(s): POCBNP No results for input(s): DDIMER in the last 72 hours. No results for input(s): HGBA1C in the last 72 hours. No results for input(s): CHOL, HDL, LDLCALC, TRIG, CHOLHDL, LDLDIRECT in the last 72 hours. No results for input(s): TSH, T4TOTAL, T3FREE, THYROIDAB in the last 72 hours.  Invalid input(s): FREET3 No results for input(s): VITAMINB12, FOLATE, FERRITIN, TIBC, IRON, RETICCTPCT in the last 72 hours. Coags: No results for input(s): INR in the last 72 hours.  Invalid input(s): PT Microbiology: Recent Results (from the past 240 hour(s))  Resp Panel by RT-PCR (Flu A&B, Covid) Nasopharyngeal Swab     Status: None   Collection Time: 06/13/21 10:24 AM   Specimen: Nasopharyngeal Swab; Nasopharyngeal(NP) swabs in vial transport medium  Result Value Ref Range Status   SARS Coronavirus 2 by RT PCR NEGATIVE NEGATIVE Final    Comment: (NOTE) SARS-CoV-2 target nucleic acids are NOT DETECTED.  The SARS-CoV-2 RNA is generally detectable in upper respiratory specimens during the acute phase of infection. The lowest concentration of SARS-CoV-2 viral copies this assay can detect is 138 copies/mL. A negative result does not preclude SARS-Cov-2 infection and should not be used as the sole basis for treatment or other patient management decisions. A negative result may occur with  improper specimen collection/handling, submission of specimen other than nasopharyngeal swab, presence of viral mutation(s) within the areas targeted by this assay, and inadequate number of viral copies(<138 copies/mL). A negative result must be combined with clinical observations, patient  history, and epidemiological information. The expected result is Negative.  Fact Sheet for Patients:  EntrepreneurPulse.com.au  Fact Sheet for Healthcare Providers:  IncredibleEmployment.be  This test is no t yet approved or cleared by the [place] FDA and  has been authorized for detection and/or diagnosis of SARS-CoV-2 by FDA under an Emergency Use Authorization (EUA). This EUA will remain  in effect (meaning this test can be used) for the duration of the COVID-19 declaration under Section 564(b)(1) of the Act, 21 U.S.C.section 360bbb-3(b)(1), unless the authorization is terminated  or revoked sooner.       Influenza A by PCR NEGATIVE NEGATIVE Final   Influenza B by PCR NEGATIVE NEGATIVE Final    Comment: (NOTE) The Xpert Xpress SARS-CoV-2/FLU/RSV plus assay is intended as an aid in the diagnosis of influenza from Nasopharyngeal swab specimens and should not be used as a sole basis for treatment. Nasal washings and aspirates are unacceptable for Xpert Xpress SARS-CoV-2/FLU/RSV testing.  Fact Sheet for Patients: EntrepreneurPulse.com.au  Fact Sheet for Healthcare Providers: IncredibleEmployment.be  This test is not yet approved or cleared by the [place] FDA and has  been authorized for detection and/or diagnosis of SARS-CoV-2 by FDA under an Emergency Use Authorization (EUA). This EUA will remain in effect (meaning this test can be used) for the duration of the COVID-19 declaration under Section 564(b)(1) of the Act, 21 U.S.C. section 360bbb-3(b)(1), unless the authorization is terminated or revoked.  Performed at [facility], [place] [address], [place], [place] [address]     FURTHER DISCHARGE INSTRUCTIONS:  Get Medicines reviewed and adjusted: Please take all your medications with you for your next visit with your Primary MD  Laboratory/radiological data: Please request your  Primary MD to go over all hospital tests and procedure/radiological results at the follow up, please ask your Primary MD to get all Hospital records sent to his/her office.  In some cases, they will be blood work, cultures and biopsy results pending at the time of your discharge. Please request that your primary care M.D. goes through all the records of your hospital data and follows up on these results.  Also Note the following: If you experience worsening of your admission symptoms, develop shortness of breath, life threatening emergency, suicidal or homicidal thoughts you must seek medical attention immediately by calling 911 or calling your MD immediately  if symptoms less severe.  You must read complete instructions/literature along with all the possible adverse reactions/side effects for all the Medicines you take and that have been prescribed to you. Take any new Medicines after you have completely understood and accpet all the possible adverse reactions/side effects.   Do not drive when taking Pain medications or sleeping medications (Benzodaizepines)  Do not take more than prescribed Pain, Sleep and Anxiety Medications. It is not advisable to combine anxiety,sleep and pain medications without talking with your primary care practitioner  Special Instructions: If you have smoked or chewed Tobacco  in the last 2 yrs please stop smoking, stop any regular Alcohol  and or any Recreational drug use.  Wear Seat belts while driving.  Please note: You were cared for by a hospitalist during your hospital stay. Once you are discharged, your primary care physician will handle any further medical issues. Please note that NO REFILLS for any discharge medications will be authorized once you are discharged, as it is imperative that you return to your primary care physician (or establish a relationship with a primary care physician if you do not have one) for your post hospital discharge needs so that they  can reassess your need for medications and monitor your lab values.  Total Time spent coordinating discharge including counseling, education and face to face time equals 25 minutes.  SignedOren Binet 06/17/2021 9:43 AM

## 2021-06-17 NOTE — Plan of Care (Signed)
  Problem: Education: Goal: Knowledge of General Education information will improve Description: Including pain rating scale, medication(s)/side effects and non-pharmacologic comfort measures Outcome: Adequate for Discharge   

## 2021-07-01 ENCOUNTER — Encounter: Payer: Self-pay | Admitting: Neurology

## 2021-07-01 ENCOUNTER — Other Ambulatory Visit: Payer: Self-pay

## 2021-07-01 ENCOUNTER — Ambulatory Visit: Payer: Medicaid Other | Admitting: Neurology

## 2021-07-01 VITALS — BP 102/72 | HR 91 | Ht 66.0 in | Wt 258.5 lb

## 2021-07-01 DIAGNOSIS — E559 Vitamin D deficiency, unspecified: Secondary | ICD-10-CM

## 2021-07-01 DIAGNOSIS — G379 Demyelinating disease of central nervous system, unspecified: Secondary | ICD-10-CM | POA: Diagnosis not present

## 2021-07-01 DIAGNOSIS — H469 Unspecified optic neuritis: Secondary | ICD-10-CM | POA: Diagnosis not present

## 2021-07-01 NOTE — Progress Notes (Signed)
[facility]  PATIENT: Breanna Dominguez DOB: Jan 13, 2001  REFERRING DOCTOR OR PCP: Jeoffrey Massed, MD SOURCE: Patient, notes from emergency room admission 06/11/2021 for optic neuritis, neurology consult notes, laboratory reports, MRI images personally reviewed.  _________________________________   HISTORICAL  CHIEF COMPLAINT:  Chief Complaint  Patient presents with   New Patient (Initial Visit)    Rm 1, alone. Internal referral for optic neuritis. Was seen at the ED on 06/12/20 for R eye pn and blurry vision. Pt reports not having any pn or vision problems today. Pt is worried if her sx can occur again.     HISTORY OF PRESENT ILLNESS:  I had the pleasure of seeing your patient, Breanna Dominguez, at [facility] for neurologic consultation regarding her right optic neuritis and concern about MS.  She is a 21 year old woman who had the onset of right orbital pain1/06/2021 that worsened with eye movements.   She began to experience blurry vision 06/11/2021 and presented to the ED the next day.     She was admitted and received 5 das of IV SOlumedrol.    She had resolution of pain wihtin 2 days.the pain resolved and by the end of the infusion, she felt vision was back to baseline.     She denies visual blurriness or color desaturation now.     She denies any episode of numbness, weakness or clumsiness lasting more than 24 hours.    Currently, vision is back to baseline.   She has no difficulty with strength, sensation or coordination.  Bladder function is fine.     She has no h/o visual problems (except needing glasses, correctable to 20/20).     No diplopia.  No FH of MS or other autoimmune disorder.    Imaging reviewed: MRI of the head and orbits 06/12/2021 showed enhancement of the right optic nerve and restricted diffusion consistent with acute optic neuritis.  The brain was otherwise normal.  MRI of the cervical and thoracic spine 06/13/2021 showed normal  spinal cord.  And no severe degenerative changes.  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  No nerve root compression  Laboratory tests 06/12/2021 through 06/15/2021:  HIV negative, TSH normal, hemoglobin A1c 5.0.  Glucoses were elevated with the steroids.  REVIEW OF SYSTEMS: Constitutional: No fevers, chills, sweats, or change in appetite Eyes: No visual changes, double vision, eye pain Ear, nose and throat: No hearing loss, ear pain, nasal congestion, sore throat Cardiovascular: No chest pain, palpitations Respiratory:  No shortness of breath at rest or with exertion.   No wheezes GastrointestinaI: No nausea, vomiting, diarrhea, abdominal pain, fecal incontinence Genitourinary:  No dysuria, urinary retention or frequency.  No nocturia. Musculoskeletal:  No neck pain, back pain Integumentary: No rash, pruritus, skin lesions Neurological: as above Psychiatric: No depression at this time.  No anxiety Endocrine: No palpitations, diaphoresis, change in appetite, change in weigh or increased thirst Hematologic/Lymphatic:  No anemia, purpura, petechiae. Allergic/Immunologic: No itchy/runny eyes, nasal congestion, recent allergic reactions, rashes  ALLERGIES: Allergies  Allergen Reactions   Apple     Itchy throat    Banana     Itchy throat   Shrimp [Shellfish Allergy]     Itchy throat    HOME MEDICATIONS: No current outpatient medications on file.  PAST MEDICAL HISTORY: Past Medical History:  Diagnosis Date   Asthma    Morbid obesity (HCC)  Optic neuritis     PAST SURGICAL HISTORY: History reviewed. No pertinent surgical history.  FAMILY HISTORY: Family History  Problem Relation Age of Onset   Healthy Mother    Healthy Father     SOCIAL HISTORY:  Social History   Socioeconomic History   Marital status: Single    Spouse name: Not on file   Number of children: Not on file   Years  of education: Not on file   Highest education level: Some college, no degree  Occupational History   Not on file  Tobacco Use   Smoking status: Never   Smokeless tobacco: Never  Vaping Use   Vaping Use: Never used  Substance and Sexual Activity   Alcohol use: No   Drug use: No   Sexual activity: Yes    Birth control/protection: Injection  Other Topics Concern   Not on file  Social History Narrative   Lives with mother   Left handed   Caffeine: 1 cup of coffee am   Social Determinants of Corporate investment banker Strain: Not on file  Food Insecurity: Not on file  Transportation Needs: Not on file  Physical Activity: Not on file  Stress: Not on file  Social Connections: Not on file  Intimate Partner Violence: Not on file     PHYSICAL EXAM  Vitals:   07/01/21 0846  BP: 102/72  Pulse: 91  SpO2: 98%  Weight: 258 lb 8 oz (117.3 kg)  Height: 5\' 6"  (1.676 m)    Body mass index is 41.72 kg/m.  Vision Screening   Right eye Left eye Both eyes  Without correction 20/20 20/30 20/20   With correction     Comments: With glasses, had eye exam beginning of Dec 2022    General: The patient is well-developed and well-nourished and in no acute distress  HEENT:  Head is [place]/AT.  Sclera are anicteric.  Funduscopic exam shows normal optic discs and retinal vessels.  Neck: No carotid bruits are noted.  The neck is nontender.  Cardiovascular: The heart has a regular rate and rhythm with a normal S1 and S2. There were no murmurs, gallops or rubs.    Skin: Extremities are without rash or  edema.  Musculoskeletal:  Back is nontender  Neurologic Exam  Mental status: The patient is alert and oriented x 3 at the time of the examination. The patient has apparent normal recent and remote memory, with an apparently normal attention span and concentration ability.   Speech is normal.  Cranial nerves: Extraocular movements are full. Pupils are equal, round, and reactive to light and  accomodation.  Visual fields are full.  Facial symmetry is present. There is good facial sensation to soft touch bilaterally.Facial strength is normal.  Trapezius and sternocleidomastoid strength is normal. No dysarthria is noted.  The tongue is midline, and the patient has symmetric elevation of the soft palate. No obvious hearing deficits are noted.  Motor:  Muscle bulk is normal.   Tone is normal. Strength is  5 / 5 in all 4 extremities.   Sensory: Sensory testing is intact to pinprick, soft touch and vibration sensation in all 4 extremities.  Coordination: Cerebellar testing reveals good finger-nose-finger and heel-to-shin bilaterally.  Gait and station: Station is normal.   Gait is normal. Tandem gait is normal. Romberg is negative.   Reflexes: Deep tendon reflexes are symmetric and normal bilaterally.   Plantar responses are flexor.    DIAGNOSTIC DATA (LABS, IMAGING, TESTING) - I reviewed  patient records, labs, notes, testing and imaging myself where available.  Lab Results  Component Value Date   WBC 9.4 06/14/2021   HGB 14.2 06/14/2021   HCT 40.8 06/14/2021   MCV 85.4 06/14/2021   PLT 408 (H) 06/14/2021      Component Value Date/Time   NA 136 06/14/2021 0103   K 3.9 06/14/2021 0103   CL 106 06/14/2021 0103   CO2 21 (L) 06/14/2021 0103   GLUCOSE 195 (H) 06/14/2021 0103   BUN 10 06/14/2021 0103   CREATININE 0.95 06/14/2021 0103   CALCIUM 9.7 06/14/2021 0103   PROT 7.1 03/02/2021 1946   ALBUMIN 3.8 03/02/2021 1946   AST 20 03/02/2021 1946   ALT 20 03/02/2021 1946   ALKPHOS 63 03/02/2021 1946   BILITOT 1.2 03/02/2021 1946   GFRNONAA >60 06/14/2021 0103   Lab Results  Component Value Date   CHOL 203 (H) 06/14/2021   HDL 39 (L) 06/14/2021   LDLCALC 156 (H) 06/14/2021   TRIG 42 06/14/2021   CHOLHDL 5.2 06/14/2021   Lab Results  Component Value Date   HGBA1C 5.0 06/13/2021   No results found for: VITAMINB12 Lab Results  Component Value Date   TSH 0.890  06/13/2021       ASSESSMENT AND PLAN  Optic neuritis - Plan: MR BRAIN W WO CONTRAST, ANCA Profile, ANA w/Reflex, Sedimentation rate, C-reactive protein, Neuromyelitis optica autoab, IgG, Myelin Oligodendrocyte Glycoprotein w/ rflx, Anti-MOG, Serum  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 Sanderlin is a 21 year old young woman who had the onset of right optic neuritis 3 weeks ago.  She is doing well with resolution of eye pain and visual blurring.  She does not appear to have any sequela with normal color vision and acuity.  I had a long conversation with her about the association between optic neuritis and multiple sclerosis using data from the large optic neuritis treatment trial.  The MRI of the brain shows no other lesions besides the enhancing optic nerve.  This put her in a better category and she has about a 1/4 chance of developing MS over the next 5 years.  I recommend that we check another MRI in 6 to 9 months and then again annually for a couple years to make sure that the MRI stays stable.  If she does develop changes consistent with MS we will start a disease modifying therapy.  She will return to see me in 7 months or sooner if there are new or worsening neurologic symptoms.  Thank you for asking to see Ms. Joya Gaskins.  Please let me know if I can be of further assistance with her or other patients in the future.   Lachlyn Vanderstelt A. Felecia Shelling, MD, [facility] [id number], [id number] AM Certified in Neurology, Clinical Neurophysiology, Sleep Medicine and Neuroimaging  [facility] [address] [place], [place] [address] [phone]

## 2021-07-03 LAB — C-REACTIVE PROTEIN: CRP: 23 mg/L — ABNORMAL HIGH (ref 0–10)

## 2021-07-03 LAB — VITAMIN D 25 HYDROXY (VIT D DEFICIENCY, FRACTURES): Vit D, 25-Hydroxy: 8.3 ng/mL — ABNORMAL LOW (ref 30.0–100.0)

## 2021-07-03 LAB — SEDIMENTATION RATE: Sed Rate: 38 mm/hr — ABNORMAL HIGH (ref 0–32)

## 2021-07-03 LAB — NEUROMYELITIS OPTICA AUTOAB, IGG: NMO IgG Autoantibodies: <1.5 U/mL (ref 0.0–3.0)

## 2021-07-03 LAB — ANCA PROFILE
Anti-MPO Antibodies: <0.2 units (ref 0.0–0.9)
Anti-PR3 Antibodies: <0.2 units (ref 0.0–0.9)
Atypical pANCA: <1:20 {titer}
C-ANCA: <1:20 {titer}
P-ANCA: <1:20 {titer}

## 2021-07-03 LAB — ANA W/REFLEX: Anti Nuclear Antibody (ANA): NEGATIVE

## 2021-07-08 LAB — ANTI-MOG, SERUM: MOG Antibody, Cell-based IFA: POSITIVE — AB

## 2021-07-08 LAB — ANTI-MOG ANTIBODY TITER, SERUM: Anti-MOG Antibody Titer, Serum: 1:32 {titer}

## 2021-07-16 ENCOUNTER — Telehealth: Payer: Self-pay | Admitting: Neurology

## 2021-07-16 DIAGNOSIS — G378 Other specified demyelinating diseases of central nervous system: Secondary | ICD-10-CM | POA: Insufficient documentation

## 2021-07-16 DIAGNOSIS — H469 Unspecified optic neuritis: Secondary | ICD-10-CM

## 2021-07-16 MED ORDER — MYCOPHENOLATE MOFETIL 500 MG PO TABS: ORAL_TABLET | ORAL | 5 refills | Status: AC

## 2021-07-16 MED ORDER — VITAMIN D (ERGOCALCIFEROL) 1.25 MG (50000 UNIT) PO CAPS: 50000.0000 [IU] | ORAL_CAPSULE | ORAL | 3 refills | Status: AC

## 2021-07-16 MED ORDER — PREDNISONE 10 MG PO TABS: ORAL_TABLET | ORAL | 5 refills | Status: DC

## 2021-07-16 NOTE — Telephone Encounter (Signed)
I discussed the lab work results with Breanna Dominguez.  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  I will place her on CellCept plus prednisone.  She will come back to see me in about 3 months.  At that time, if stable, I will try to lower the prednisone and maybe even be able to taper it off over time.  If she has an exacerbation while on CellCept plus prednisone we will have to consider one of the biologic agents such as Actemra or Rituxan.  Additionally the vitamin D was low and I will send in supplements.

## 2021-08-07 ENCOUNTER — Telehealth: Payer: Self-pay | Admitting: Neurology

## 2021-08-07 NOTE — Telephone Encounter (Signed)
Follow-up Ophthalmology appt with Dr. Ernesto Rutherford 08/05/21 showed normal VF testing ?

## 2021-08-20 ENCOUNTER — Ambulatory Visit: Payer: Medicaid Other

## 2021-08-25 ENCOUNTER — Ambulatory Visit: Payer: Medicaid Other

## 2021-08-28 ENCOUNTER — Ambulatory Visit: Payer: Medicaid Other

## 2021-09-02 ENCOUNTER — Ambulatory Visit: Payer: Medicaid Other

## 2021-09-10 ENCOUNTER — Ambulatory Visit (INDEPENDENT_AMBULATORY_CARE_PROVIDER_SITE_OTHER): Payer: Medicaid Other | Admitting: *Deleted

## 2021-09-10 ENCOUNTER — Ambulatory Visit: Payer: Medicaid Other | Admitting: Obstetrics

## 2021-09-10 DIAGNOSIS — Z3042 Encounter for surveillance of injectable contraceptive: Secondary | ICD-10-CM | POA: Diagnosis not present

## 2021-09-10 MED ORDER — MEDROXYPROGESTERONE ACETATE 150 MG/ML IM SUSP: 150.0000 mg | INTRAMUSCULAR | 1 refills | Status: AC

## 2021-09-10 MED ORDER — MEDROXYPROGESTERONE ACETATE 150 MG/ML IM SUSP
150.0000 mg | Freq: Once | INTRAMUSCULAR | Status: AC
  Administered 2021-09-10: 150 mg via INTRAMUSCULAR

## 2021-09-10 NOTE — Progress Notes (Signed)
Date last pap: 02/27/21. ?Last Depo-Provera: 05/29/21. ?Side Effects if any: NA. ?Serum HCG indicated? NA. ?Depo-Provera 150 mg IM given by: Selena Batten. Tniyah Nakagawa, RNC in RUQ glut. ?Next appointment due 11/26/21-12/10/21.  ?

## 2021-10-08 ENCOUNTER — Telehealth: Payer: Self-pay | Admitting: Neurology

## 2021-10-08 NOTE — Telephone Encounter (Signed)
Pt's phone # out of service, LVM on mother's cell # and sent MyChart msg asking pt to call back and r/s 5/15 appt- Dr. Epimenio Foot out. ?

## 2021-10-20 ENCOUNTER — Ambulatory Visit: Payer: Medicaid Other | Admitting: Neurology

## 2021-10-23 ENCOUNTER — Ambulatory Visit: Payer: Medicaid Other | Admitting: Neurology

## 2021-11-26 ENCOUNTER — Ambulatory Visit: Payer: Medicaid Other

## 2021-11-27 ENCOUNTER — Ambulatory Visit: Payer: Medicaid Other

## 2021-12-01 ENCOUNTER — Ambulatory Visit: Payer: Medicaid Other

## 2021-12-08 ENCOUNTER — Ambulatory Visit: Payer: Medicaid Other

## 2021-12-15 ENCOUNTER — Other Ambulatory Visit: Payer: Medicaid Other

## 2021-12-15 ENCOUNTER — Ambulatory Visit
Admission: RE | Admit: 2021-12-15 | Discharge: 2021-12-15 | Discharge status: Absent without leave | Payer: Self-pay | Source: Ambulatory Visit

## 2021-12-15 ENCOUNTER — Ambulatory Visit
Admission: EM | Admit: 2021-12-15 | Discharge: 2021-12-15 | Disposition: A | Discharge status: Home / Self Care | Payer: Medicaid Other | Attending: Internal Medicine | Admitting: Internal Medicine

## 2021-12-15 ENCOUNTER — Encounter: Payer: Self-pay | Admitting: Emergency Medicine

## 2021-12-15 DIAGNOSIS — K047 Periapical abscess without sinus: Secondary | ICD-10-CM | POA: Diagnosis not present

## 2021-12-15 DIAGNOSIS — K0889 Other specified disorders of teeth and supporting structures: Secondary | ICD-10-CM | POA: Diagnosis not present

## 2021-12-15 MED ORDER — AMOXICILLIN-POT CLAVULANATE 875-125 MG PO TABS: 1.0000 | ORAL_TABLET | Freq: Two times a day (BID) | ORAL | 0 refills | Status: DC

## 2021-12-15 NOTE — ED Triage Notes (Signed)
Patient c/o a bump inside of her left gum x 1 week.  No injury.  The area is painful.  Patient has been taken Ibuprofen.

## 2021-12-15 NOTE — ED Provider Notes (Signed)
[facility]    CSN: [id number] Arrival date & time: 12/15/21  1058      History   Chief Complaint Chief Complaint  Patient presents with   Appointment    HPI Breanna Dominguez is a 21 y.o. female.   Patient presents with left lower back dental pain with associated "bump" that has been present for approximately 1 week.  She states that the area is painful.  Denies any purulent drainage, fever, body aches, chills.  Patient has taken ibuprofen with some improvement in pain. She has not seen a dentist.      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 No pertinent surgical history.  OB History     Gravida  1   Para      Term      Preterm      AB  1   Living         SAB      IAB      Ectopic      Multiple      Live Births               Home Medications    Prior to Admission medications   Medication Sig Start Date End Date Taking? Authorizing Provider  amoxicillin-clavulanate (AUGMENTIN) 875-125 MG tablet Take 1 tablet by mouth every 12 (twelve) hours. 12/15/21  Yes Mihaela Fajardo, Rolly Salter E, FNP  medroxyPROGESTERone (DEPO-PROVERA) 150 MG/ML injection Inject 1 mL (150 mg total) into the muscle every 3 (three) months. 09/10/21  Yes Constant, Peggy, MD  mycophenolate (CELLCEPT) 500 MG tablet 1 p.o. every morning and 2 p.o. nightly 07/16/21  Yes Sater, Pearletha Furl, MD  predniSONE (DELTASONE) 10 MG tablet 2 p.o. daily 07/16/21  Yes Sater, Pearletha Furl, MD  Vitamin D, Ergocalciferol, (DRISDOL) 1.25 MG (50000 UNIT) CAPS capsule Take 1 capsule (50,000 Units total) by mouth every 7 (seven) days. 07/16/21  Yes Sater, Pearletha Furl, MD    Family  History Family History  Problem Relation Age of Onset   Healthy Mother    Healthy Father     Social History Social History   Tobacco Use   Smoking status: Never   Smokeless tobacco: Never  Vaping Use   Vaping Use: Never used  Substance Use Topics   Alcohol use: No   Drug use: No     Allergies   Apple juice, Banana, and Shrimp [shellfish allergy]   Review of Systems Review of Systems Per HPI  Physical Exam Triage Vital Signs ED Triage Vitals  Enc Vitals Group     BP 12/15/21 1114 114/76     Pulse Rate 12/15/21 1114 72     Resp 12/15/21 1114 18     Temp 12/15/21 1114 98.2 F (36.8 C)     Temp Source 12/15/21 1114 Oral     SpO2 12/15/21 1114 92 %     Weight 12/15/21 1116 250 lb (113.4 kg)     Height 12/15/21 1116 5\' 6"  (1.676 m)     Head Circumference --      Peak Flow --      Pain Score 12/15/21 1116  8     Pain Loc --      Pain Edu? --      Excl. in GC? --    No data found.  Updated Vital Signs BP 114/76 (BP Location: Left Arm)   Pulse 72   Temp 98.2 F (36.8 C) (Oral)   Resp 18   Ht 5\' 6"  (1.676 m)   Wt 250 lb (113.4 kg)   SpO2 95%   BMI 40.35 kg/m   Visual Acuity Right Eye Distance:   Left Eye Distance:   Bilateral Distance:    Right Eye Near:   Left Eye Near:    Bilateral Near:     Physical Exam Constitutional:      General: She is not in acute distress.    Appearance: Normal appearance. She is not toxic-appearing or diaphoretic.  HENT:     Head: Normocephalic and atraumatic.     Mouth/Throat:     Dentition: Abnormal dentition. Dental tenderness and gingival swelling present.      Comments: Approximately 1 mm x 1 mm fluctuant raised area present to left lower dentition behind back tooth. Eyes:     Extraocular Movements: Extraocular movements intact.     Conjunctiva/sclera: Conjunctivae normal.  Pulmonary:     Effort: Pulmonary effort is normal.  Neurological:     General: No focal deficit present.     Mental Status: She is  alert and oriented to person, place, and time. Mental status is at baseline.  Psychiatric:        Mood and Affect: Mood normal.        Behavior: Behavior normal.        Thought Content: Thought content normal.        Judgment: Judgment normal.      UC Treatments / Results  Labs (all labs ordered are listed, but only abnormal results are displayed) Labs Reviewed - No data to display  EKG   Radiology No results found.  Procedures Procedures (including critical care time)  Medications Ordered in UC Medications - No data to display  Initial Impression / Assessment and Plan / UC Course  I have reviewed the triage vital signs and the nursing notes.  Pertinent labs & imaging results that were available during my care of the patient were reviewed by me and considered in my medical decision making (see chart for details).     It appears the patient may have dental abscess possibly.  Will treat for dental infection with Augmentin antibiotic.  Patient advised to follow-up with dentist for further evaluation and management.  No I&D necessary given patient needs to see dentist for this.  Discussed return precautions.  Patient verbalized understanding and was agreeable with plan. Final Clinical Impressions(s) / UC Diagnoses   Final diagnoses:  Pain, dental  Dental infection     Discharge Instructions      You have a dental infection which is being treated with antibiotic.  Please follow-up with dentist for further evaluation and management.    ED Prescriptions     Medication Sig Dispense Auth. Provider   amoxicillin-clavulanate (AUGMENTIN) 875-125 MG tablet Take 1 tablet by mouth every 12 (twelve) hours. 14 tablet [place], [place], Acie Fredrickson      PDMP not reviewed this encounter.   [place], Gustavus Bryant 12/15/21 1140

## 2021-12-15 NOTE — Discharge Instructions (Signed)
You have a dental infection which is being treated with antibiotic.  Please follow-up with dentist for further evaluation and management. 

## 2021-12-26 ENCOUNTER — Other Ambulatory Visit: Payer: Medicaid Other

## 2022-01-08 ENCOUNTER — Ambulatory Visit: Payer: Medicaid Other | Admitting: Neurology

## 2022-01-09 ENCOUNTER — Inpatient Hospital Stay: Admission: RE | Admit: 2022-01-09 | Payer: Medicaid Other | Source: Ambulatory Visit

## 2022-05-25 ENCOUNTER — Telehealth: Payer: Self-pay | Admitting: Neurology

## 2022-05-25 NOTE — Telephone Encounter (Signed)
Pt last seen 07/01/21. Can you provide her last OV note? This will include info she is needing.

## 2022-05-25 NOTE — Telephone Encounter (Signed)
Pt is calling. Stated she need a letter stating she is seen at this office and what type of medication she is on. Pt said she need this letter for school.

## 2022-05-28 ENCOUNTER — Ambulatory Visit (INDEPENDENT_AMBULATORY_CARE_PROVIDER_SITE_OTHER): Payer: Medicaid Other | Admitting: Neurology

## 2022-05-28 ENCOUNTER — Encounter: Payer: Self-pay | Admitting: Neurology

## 2022-05-28 VITALS — BP 126/62 | HR 73 | Ht 66.0 in | Wt 269.9 lb

## 2022-05-28 DIAGNOSIS — H469 Unspecified optic neuritis: Secondary | ICD-10-CM

## 2022-05-28 DIAGNOSIS — Z79899 Other long term (current) drug therapy: Secondary | ICD-10-CM | POA: Diagnosis not present

## 2022-05-28 DIAGNOSIS — E559 Vitamin D deficiency, unspecified: Secondary | ICD-10-CM

## 2022-05-28 DIAGNOSIS — G3781 Myelin oligodendrocyte glycoprotein antibody disease: Secondary | ICD-10-CM | POA: Diagnosis not present

## 2022-05-28 MED ORDER — PREDNISONE 5 MG PO TABS: ORAL_TABLET | ORAL | 3 refills | Status: AC

## 2022-05-28 NOTE — Progress Notes (Signed)
[facility]  PATIENT: Breanna Dominguez DOB: [birth year]  REFERRING DOCTOR OR PCP: Oren Binet, MD SOURCE: Patient, notes from emergency room admission 06/11/2021 for optic neuritis, neurology consult notes, laboratory reports, MRI images personally reviewed.  _________________________________   HISTORICAL  CHIEF COMPLAINT:  Chief Complaint  Patient presents with   New Patient (Initial Visit)    Rm 1, alone. Internal referral for optic neuritis. Was seen at the ED on 06/12/20 for R eye pn and blurry vision. Pt reports not having any pn or vision problems today. Pt is worried if her sx can occur again.     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   She began to experience blurry vision 06/11/2021 and presented to the ED the next day.     She was admitted and received 5 das of IV SOlumedrol.    She had resolution of pain wihtin 2 days.the pain resolved and by the end of the infusion, she felt vision was back to baseline.     She denies visual blurriness or color desaturation now.     She is on Cellcept 500 mg qAM and 1000 mg qPM and prednisone 20 mg po qd.  Weight is stable.   She ddi not have a preceding viral syndrome or vaccination.  Gait and balance are fine.  She denies any episode of numbness, weakness or clumsiness lasting more than 24 hours.    She reports vision is back to baseline.   She has no difficulty with strength, sensation or coordination.  Bladder function is fine.     She has no h/o visual problems (except needing glasses, correctable to 20/20).     No diplopia.  No FH of MS or other autoimmune disorder.    Imaging reviewed: MRI of the head and orbits 06/12/2021 showed enhancement of the right optic nerve and restricted diffusion consistent with acute optic neuritis.  The brain was otherwise normal.  MRI of the  cervical and thoracic spine 06/13/2021 showed normal spinal cord.  And no severe degenerative changes.  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  No nerve root compression  Laboratory tests 06/12/2021 through 06/15/2021:  HIV negative, TSH normal, hemoglobin A1c 5.0.  Glucoses were elevated with the steroids. 07/01/2021:  Anti-MOG positive 1:32; anti-NMO negative, ANA neg, ESR normal, ANCA negative   Vit D 8.3  REVIEW OF SYSTEMS: Constitutional: No fevers, chills, sweats, or change in appetite Eyes: No visual changes, double vision, eye pain Ear, nose and throat: No hearing loss, ear pain, nasal congestion, sore throat Cardiovascular: No chest pain, palpitations Respiratory:  No shortness of breath at rest or with exertion.   No wheezes GastrointestinaI: No nausea, vomiting, diarrhea, abdominal pain, fecal incontinence Genitourinary:  No dysuria, urinary retention or frequency.  No nocturia. Musculoskeletal:  No neck pain, back pain Integumentary: No rash, pruritus, skin lesions Neurological: as above Psychiatric: No depression at this time.  No anxiety Endocrine: No palpitations, diaphoresis, change in appetite, change in weigh or increased thirst Hematologic/Lymphatic:  No anemia, purpura, petechiae. Allergic/Immunologic: No itchy/runny eyes, nasal congestion, recent allergic reactions, rashes  ALLERGIES: Allergies  Allergen Reactions   Apple     Itchy throat    Banana     Itchy throat   Shrimp [  Shellfish Allergy]     Itchy throat    HOME MEDICATIONS: No current outpatient medications on file.  PAST MEDICAL HISTORY: Past Medical History:  Diagnosis Date   Asthma    Morbid obesity ([place])    Optic neuritis     PAST SURGICAL HISTORY: History reviewed. No pertinent surgical history.  FAMILY HISTORY: Family History  Problem Relation Age of Onset   Healthy Mother    Healthy Father     SOCIAL  HISTORY:  Social History   Socioeconomic History   Marital status: Single    Spouse name: Not on file   Number of children: Not on file   Years of education: Not on file   Highest education level: Some college, no degree  Occupational History   Not on file  Tobacco Use   Smoking status: Never   Smokeless tobacco: Never  Vaping Use   Vaping Use: Never used  Substance and Sexual Activity   Alcohol use: No   Drug use: No   Sexual activity: Yes    Birth control/protection: Injection  Other Topics Concern   Not on file  Social History Narrative   Lives with mother   Left handed   Caffeine: 1 cup of coffee am   Social Determinants of Radio broadcast assistant Strain: Not on file  Food Insecurity: Not on file  Transportation Needs: Not on file  Physical Activity: Not on file  Stress: Not on file  Social Connections: Not on file  Intimate Partner Violence: Not on file     PHYSICAL EXAM  Vitals:   07/01/21 0846  BP: 102/72  Pulse: 91  SpO2: 98%  Weight: 258 lb 8 oz (117.3 kg)  Height: _0  (1.676 m)    Body mass index is 41.72 kg/m.  Vision Screening   Right eye Left eye Both eyes  Without correction _1  With correction     Comments: With glasses, had eye exam beginning of Dec 2022    General: The patient is well-developed and well-nourished and in no acute distress  HEENT:  Head is [place]/AT.  Sclera are anicteric.  Funduscopic exam shows normal optic discs and retinal vessels.  Skin: Extremities are without rash or  edema.  Musculoskeletal:  Back is nontender  Neurologic Exam  Mental status: The patient is alert and oriented x 3 at the time of the examination. The patient has apparent normal recent and remote memory, with an apparently normal attention span and concentration ability.   Speech is normal.  Cranial nerves: Extraocular movements are full. Pupils are equal, round, and reactive to light and accomodation.  Color vision was  normal and symmetric.  Facial strength and sensation was normal.. No obvious hearing deficits are noted.  Motor:  Muscle bulk is normal.   Tone is normal. Strength is  5 / 5 in all 4 extremities.   Sensory: Intact sensation to touch x 4  Coordination: Cerebellar testing reveals good finger-nose-finger and heel-to-shin bilaterally.  Gait and station: Station is normal.   Gait is normal. Tandem gait is normal. Romberg is negative.   Reflexes: Deep tendon reflexes are symmetric and normal bilaterally.       DIAGNOSTIC DATA (LABS, IMAGING, TESTING) - I reviewed patient records, labs, notes, testing and imaging myself where available.  Lab Results  Component Value Date   WBC 9.4 06/14/2021   HGB 14.2 06/14/2021   HCT 40.8 06/14/2021   MCV 85.4 06/14/2021   PLT 408 (H)  06/14/2021      Component Value Date/Time   NA 136 06/14/2021 0103   K 3.9 06/14/2021 0103   CL 106 06/14/2021 0103   CO2 21 (L) 06/14/2021 0103   GLUCOSE 195 (H) 06/14/2021 0103   BUN 10 06/14/2021 0103   CREATININE 0.95 06/14/2021 0103   CALCIUM 9.7 06/14/2021 0103   PROT 7.1 03/02/2021 1946   ALBUMIN 3.8 03/02/2021 1946   AST 20 03/02/2021 1946   ALT 20 03/02/2021 1946   ALKPHOS 63 03/02/2021 1946   BILITOT 1.2 03/02/2021 1946   GFRNONAA >60 06/14/2021 0103   Lab Results  Component Value Date   CHOL 203 (H) 06/14/2021   HDL 39 (L) 06/14/2021   LDLCALC 156 (H) 06/14/2021   TRIG 42 06/14/2021   CHOLHDL 5.2 06/14/2021   Lab Results  Component Value Date   HGBA1C 5.0 06/13/2021   No results found for: VITAMINB12 Lab Results  Component Value Date   TSH 0.890 06/13/2021       ASSESSMENT AND PLAN  Optic neuritis - Plan: MR BRAIN W WO CONTRAST, ANCA Profile, ANA w/Reflex, Sedimentation rate, C-reactive protein, Neuromyelitis optica autoab, IgG, Myelin Oligodendrocyte Glycoprotein w/ rflx, Anti-MOG, Serum  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  I will lower the prednisone dose over the next 6 weeks to 5 mg daily and she will continue the CellCept dose.  We will check some lab work today.  I discussed with her that MOGAD is sometimes a monophasic illness and if she continues to do well we will consider stopping the medication in a year or so.  Does experience an exacerbation we will need to consider stepping up to therapy to Actemra or Rituxan. Check vitamin D.  Continue supplements. Return in 6 months or sooner if there are new or worsening neurologic symptoms.   Misbah Hornaday A. Felecia Shelling, MD, [facility] 6/55/3748, 2:70 AM Certified in Neurology, Clinical Neurophysiology, Sleep Medicine and Neuroimaging  [facility] [address] [place], [place] [address] [phone]

## 2022-05-28 NOTE — Patient Instructions (Signed)
Prednisone 5 mg tablets: Take 3 a day for 2 weeks Then take 2 a day for 2 weeks Then keep taking 1 a day  Continue mycophenolate one in morning and two at night

## 2022-05-29 ENCOUNTER — Telehealth: Payer: Self-pay | Admitting: Neurology

## 2022-07-13 NOTE — H&P (Signed)
  Patient: Milissa Fesperman  PID: [id number]  DOB: [birth year]  SEX: Female   Patient referred by  DDS for extraction tooth #15.  CC: Pain in the past, none now.  Past Medical History:  Morbid Obesity    Medications: None    Allergies:     NKDA    Surgeries:   None     Social History       Smoking:            Alcohol: Drug use:                             Exam: BMI 40. Large decay #15.   No purulence, edema, fluctuance, trismus. Oral cancer screening negative. Pharynx clear. No lymphadenopathy.  Panorex:Large decay #15. Impacted 1, 51, 52, 16, 66, 32.  Assessment: ASA 3. Non-restorable   tooth #15. impacted teeth # 1, 51, 52, 16, 66, 32.            Plan: Extraction Teeth #  1, 51, 52, 15, 16, 66, 32. Hospital Day surgery.                 Rx: n               Risks and complications explained. Questions answered.   Gae Bon, DMD

## 2022-07-15 ENCOUNTER — Other Ambulatory Visit: Payer: Self-pay

## 2022-07-15 ENCOUNTER — Encounter (HOSPITAL_COMMUNITY): Payer: Self-pay | Admitting: Oral Surgery

## 2022-07-15 NOTE — Pre-Procedure Instructions (Signed)
PCP - [facility]  Cardiologist - pt denies  PPM/ICD - pt denies Device Orders - n/a Rep Notified - n/a  EKG - 01/26/17 Stress Test - pt denies ECHO - pt denies Cardiac Cath - pt denies  Sleep Study/CPAP - pt deneis  Diabetic- pt denies Fasting Blood Sugar -  Checks Blood Sugar _____ times a day  Last dose of GLP1 agonist-  pt denies GLP1 instructions: n/a  Blood Thinner Instructions:pt denies Aspirin Instructions:n/a  ERAS Protcol - NPO after Midnight   COVID TEST- n/a   Anesthesia review: NO   Patient verbally denies any shortness of breath, fever, cough and chest pain during phone call.     -------------  SDW INSTRUCTIONS given:   Your procedure is scheduled on 07/17/22.             Report to [facility] Main Entrance "A" at  6:15  A.M., and check in at the Admitting office.             Call this number if you have problems the morning of surgery:             [phone]               Remember:             Do not eat or drink after midnight the night before your surgery                          Take these medicines the morning of surgery with A SIP OF WATER  predniSONE (DELTASONE) ,amoxicillin-clavulanate (AUGMENTIN)   As of today, STOP taking any Aspirin (unless otherwise instructed by your surgeon) Aleve, Naproxen, Ibuprofen, Motrin, Advil, Goody's, BC's, all herbal medications, fish oil, and all vitamins.                        Do not wear jewelry, make up, or nail polish            Do not wear lotions, powders, perfumes/colognes, or deodorant.            Do not shave 48 hours prior to surgery.  Men may shave face and neck.            Do not bring valuables to the hospital.            [facility] is not responsible for any belongings or valuables.   Do NOT Smoke (Tobacco/Vaping) 24 hours prior to your procedure If you use a CPAP at night, you may bring all equipment for your overnight stay.   Contacts, glasses, dentures or partials may not  be worn into surgery.      For patients admitted to the hospital, discharge time will be determined by your treatment team.   Patients discharged the day of surgery will not be allowed to drive home, and someone needs to stay with them for 24 hours.     Special instructions:   [place]- Preparing For Surgery  Oral Hygiene is also important to reduce your risk of infection.  Remember - BRUSH YOUR TEETH THE MORNING OF SURGERY WITH YOUR REGULAR TOOTHPASTE   Before surgery, you can play an important role. Because skin is not sterile, your skin needs to be as free of germs as possible. You can reduce the number of germs on your skin by washing with Dial (or any antibacterial) Soap before surgery.  Please do not use if you have an allergy to antibacterial soaps. If your skin becomes reddened/irritated stop using the Antibacterial Soap.Do not shave (including legs and underarms) for at least 48 hours prior to surgery. It is OK to shave your face.   Please follow these instructions carefully.              Shower the NIGHT BEFORE SURGERY and the MORNING OF SURGERY with (DIAL) Antibacterial Soap. Wash your body and hair with your normal shampoo/soap then rinse. Using a clean wash cloth wash from your neck down using the antibacterial soap, do not wash private area with the clean wash cloth. Wash thoroughly, paying special attention to the area where your surgery will be performed. Thoroughly rinse your body with warm water from the neck down.   Pat yourself dry with a CLEAN TOWEL.   Wear CLEAN PAJAMAS to bed the night before surgery.   Place CLEAN SHEETS on your bed the night of your surgery and DO NOT SLEEP WITH PETS.     Day of Surgery: Please shower morning of surgery using antibacterial soap Wear Clean/Comfortable clothing the morning of surgery Do not apply any deodorants/lotions.   Remember to brush your teeth WITH YOUR REGULAR TOOTHPASTE.   Questions were answered. Patient  verbalized understanding of instructions.

## 2022-07-17 ENCOUNTER — Other Ambulatory Visit: Payer: Self-pay

## 2022-07-17 ENCOUNTER — Ambulatory Visit (HOSPITAL_COMMUNITY): Payer: Medicaid Other | Admitting: Anesthesiology

## 2022-07-17 ENCOUNTER — Ambulatory Visit (HOSPITAL_BASED_OUTPATIENT_CLINIC_OR_DEPARTMENT_OTHER): Payer: Medicaid Other | Admitting: Anesthesiology

## 2022-07-17 ENCOUNTER — Encounter (HOSPITAL_COMMUNITY): Payer: Self-pay | Admitting: Oral Surgery

## 2022-07-17 ENCOUNTER — Ambulatory Visit (HOSPITAL_COMMUNITY)
Admission: RE | Admit: 2022-07-17 | Discharge: 2022-07-17 | Disposition: A | Discharge status: Home / Self Care | Payer: Medicaid Other | Attending: Oral Surgery | Admitting: Oral Surgery

## 2022-07-17 ENCOUNTER — Encounter (HOSPITAL_COMMUNITY)
Admission: RE | Disposition: A | Discharge status: Home / Self Care | Payer: Self-pay | Source: Home / Self Care | Attending: Oral Surgery

## 2022-07-17 DIAGNOSIS — K011 Impacted teeth: Secondary | ICD-10-CM | POA: Diagnosis present

## 2022-07-17 DIAGNOSIS — G3781 Myelin oligodendrocyte glycoprotein antibody disease: Secondary | ICD-10-CM | POA: Insufficient documentation

## 2022-07-17 DIAGNOSIS — J45909 Unspecified asthma, uncomplicated: Secondary | ICD-10-CM | POA: Insufficient documentation

## 2022-07-17 DIAGNOSIS — Z6841 Body Mass Index (BMI) 40.0 and over, adult: Secondary | ICD-10-CM | POA: Diagnosis not present

## 2022-07-17 DIAGNOSIS — K029 Dental caries, unspecified: Secondary | ICD-10-CM

## 2022-07-17 DIAGNOSIS — K0889 Other specified disorders of teeth and supporting structures: Secondary | ICD-10-CM

## 2022-07-17 HISTORY — PX: TOOTH EXTRACTION: SHX859

## 2022-07-17 LAB — CBC
HCT: 42.3 % (ref 36.0–46.0)
Hemoglobin: 14.3 g/dL (ref 12.0–15.0)
MCH: 29.4 pg (ref 26.0–34.0)
MCHC: 33.8 g/dL (ref 30.0–36.0)
MCV: 87 fL (ref 80.0–100.0)
Platelets: 302 10*3/uL (ref 150–400)
RBC: 4.86 MIL/uL (ref 3.87–5.11)
RDW: 12.9 % (ref 11.5–15.5)
WBC: 6.2 10*3/uL (ref 4.0–10.5)
nRBC: 0 % (ref 0.0–0.2)

## 2022-07-17 LAB — POCT PREGNANCY, URINE: Preg Test, Ur: NEGATIVE

## 2022-07-17 SURGERY — DENTAL RESTORATION/EXTRACTIONS
Anesthesia: General | Site: Mouth

## 2022-07-17 MED ORDER — FENTANYL CITRATE (PF) 100 MCG/2ML IJ SOLN
25.0000 ug | INTRAMUSCULAR | Status: DC | PRN
  Administered 2022-07-17: 50 ug via INTRAVENOUS

## 2022-07-17 MED ORDER — OXYCODONE HCL 5 MG PO TABS
ORAL_TABLET | ORAL | Status: AC
  Filled 2022-07-17: qty 1

## 2022-07-17 MED ORDER — MIDAZOLAM HCL 2 MG/2ML IJ SOLN
INTRAMUSCULAR | Status: DC | PRN
  Administered 2022-07-17: 2 mg via INTRAVENOUS

## 2022-07-17 MED ORDER — ONDANSETRON HCL 4 MG/2ML IJ SOLN
INTRAMUSCULAR | Status: DC | PRN
  Administered 2022-07-17: 4 mg via INTRAVENOUS

## 2022-07-17 MED ORDER — OXYMETAZOLINE HCL 0.05 % NA SOLN
NASAL | Status: AC
  Filled 2022-07-17: qty 30

## 2022-07-17 MED ORDER — HYDROCODONE-ACETAMINOPHEN 5-325 MG PO TABS: 1.0000 | ORAL_TABLET | Freq: Four times a day (QID) | ORAL | 0 refills | Status: AC | PRN

## 2022-07-17 MED ORDER — DEXAMETHASONE SODIUM PHOSPHATE 10 MG/ML IJ SOLN
INTRAMUSCULAR | Status: AC
  Filled 2022-07-17: qty 1

## 2022-07-17 MED ORDER — LIDOCAINE 2% (20 MG/ML) 5 ML SYRINGE
INTRAMUSCULAR | Status: AC
  Filled 2022-07-17: qty 5

## 2022-07-17 MED ORDER — SUCCINYLCHOLINE CHLORIDE 200 MG/10ML IV SOSY
PREFILLED_SYRINGE | INTRAVENOUS | Status: AC
  Filled 2022-07-17: qty 10

## 2022-07-17 MED ORDER — LIDOCAINE-EPINEPHRINE 2 %-1:100000 IJ SOLN
INTRAMUSCULAR | Status: AC
  Filled 2022-07-17: qty 1

## 2022-07-17 MED ORDER — LIDOCAINE 2% (20 MG/ML) 5 ML SYRINGE
INTRAMUSCULAR | Status: DC | PRN
  Administered 2022-07-17: 100 mg via INTRAVENOUS

## 2022-07-17 MED ORDER — ACETAMINOPHEN 500 MG PO TABS
1000.0000 mg | ORAL_TABLET | Freq: Once | ORAL | Status: AC
  Administered 2022-07-17: 1000 mg via ORAL
  Filled 2022-07-17: qty 2

## 2022-07-17 MED ORDER — CHLORHEXIDINE GLUCONATE 0.12 % MT SOLN
15.0000 mL | Freq: Once | OROMUCOSAL | Status: AC
  Administered 2022-07-17: 15 mL via OROMUCOSAL
  Filled 2022-07-17: qty 15

## 2022-07-17 MED ORDER — SODIUM CHLORIDE 0.9 % IR SOLN
Status: DC | PRN
  Administered 2022-07-17: 250 mL

## 2022-07-17 MED ORDER — OXYCODONE HCL 5 MG PO TABS
5.0000 mg | ORAL_TABLET | Freq: Once | ORAL | Status: AC | PRN
  Administered 2022-07-17: 5 mg via ORAL

## 2022-07-17 MED ORDER — ORAL CARE MOUTH RINSE: 15.0000 mL | Freq: Once | OROMUCOSAL | Status: AC

## 2022-07-17 MED ORDER — OXYCODONE HCL 5 MG/5ML PO SOLN: 5.0000 mg | Freq: Once | ORAL | Status: AC | PRN

## 2022-07-17 MED ORDER — FENTANYL CITRATE (PF) 100 MCG/2ML IJ SOLN
INTRAMUSCULAR | Status: AC
  Filled 2022-07-17: qty 2

## 2022-07-17 MED ORDER — PROPOFOL 10 MG/ML IV BOLUS
INTRAVENOUS | Status: DC | PRN
  Administered 2022-07-17: 200 mg via INTRAVENOUS

## 2022-07-17 MED ORDER — ONDANSETRON HCL 4 MG/2ML IJ SOLN
INTRAMUSCULAR | Status: AC
  Filled 2022-07-17: qty 2

## 2022-07-17 MED ORDER — MIDAZOLAM HCL 2 MG/2ML IJ SOLN
INTRAMUSCULAR | Status: AC
  Filled 2022-07-17: qty 2

## 2022-07-17 MED ORDER — AMOXICILLIN 500 MG PO CAPS: 500.0000 mg | ORAL_CAPSULE | Freq: Three times a day (TID) | ORAL | 0 refills | Status: AC

## 2022-07-17 MED ORDER — 0.9 % SODIUM CHLORIDE (POUR BTL) OPTIME
TOPICAL | Status: DC | PRN
  Administered 2022-07-17: 1000 mL

## 2022-07-17 MED ORDER — DEXTROSE 5 % IV SOLN
INTRAVENOUS | Status: DC | PRN
  Administered 2022-07-17: 3 g via INTRAVENOUS

## 2022-07-17 MED ORDER — LIDOCAINE-EPINEPHRINE 2 %-1:100000 IJ SOLN
INTRAMUSCULAR | Status: DC | PRN
  Administered 2022-07-17: 20 mL via INTRADERMAL

## 2022-07-17 MED ORDER — FENTANYL CITRATE (PF) 250 MCG/5ML IJ SOLN
INTRAMUSCULAR | Status: AC
  Filled 2022-07-17: qty 5

## 2022-07-17 MED ORDER — PROPOFOL 10 MG/ML IV BOLUS
INTRAVENOUS | Status: AC
  Filled 2022-07-17: qty 20

## 2022-07-17 MED ORDER — CEFAZOLIN IN SODIUM CHLORIDE 3-0.9 GM/100ML-% IV SOLN
3.0000 g | INTRAVENOUS | Status: DC
  Filled 2022-07-17: qty 100

## 2022-07-17 MED ORDER — OXYMETAZOLINE HCL 0.05 % NA SOLN
NASAL | Status: DC | PRN
  Administered 2022-07-17: 1 via NASAL
  Administered 2022-07-17 (×2): 2 via NASAL

## 2022-07-17 MED ORDER — FENTANYL CITRATE (PF) 250 MCG/5ML IJ SOLN
INTRAMUSCULAR | Status: DC | PRN
  Administered 2022-07-17: 50 ug via INTRAVENOUS
  Administered 2022-07-17: 100 ug via INTRAVENOUS

## 2022-07-17 MED ORDER — ONDANSETRON HCL 4 MG/2ML IJ SOLN: 4.0000 mg | Freq: Once | INTRAMUSCULAR | Status: DC | PRN

## 2022-07-17 MED ORDER — DEXAMETHASONE SODIUM PHOSPHATE 10 MG/ML IJ SOLN
INTRAMUSCULAR | Status: DC | PRN
  Administered 2022-07-17: 10 mg via INTRAVENOUS

## 2022-07-17 MED ORDER — DEXMEDETOMIDINE HCL IN NACL 80 MCG/20ML IV SOLN
INTRAVENOUS | Status: DC | PRN
  Administered 2022-07-17: 12 ug via BUCCAL

## 2022-07-17 MED ORDER — SUCCINYLCHOLINE CHLORIDE 200 MG/10ML IV SOSY
PREFILLED_SYRINGE | INTRAVENOUS | Status: DC | PRN
  Administered 2022-07-17: 120 mg via INTRAVENOUS

## 2022-07-17 MED ORDER — LACTATED RINGERS IV SOLN: INTRAVENOUS | Status: DC

## 2022-07-17 SURGICAL SUPPLY — 37 items
BAG COUNTER SPONGE SURGICOUNT (BAG) IMPLANT
BLADE SURG 15 STRL LF DISP TIS (BLADE) ×1 IMPLANT
BLADE SURG 15 STRL SS (BLADE) ×1
BUR CROSS CUT FISSURE 1.6 (BURR) ×1 IMPLANT
BUR EGG ELITE 4.0 (BURR) ×1 IMPLANT
CANISTER SUCT 3000ML PPV (MISCELLANEOUS) ×1 IMPLANT
COVER SURGICAL LIGHT HANDLE (MISCELLANEOUS) ×1 IMPLANT
GAUZE PACKING FOLDED 2  STR (GAUZE/BANDAGES/DRESSINGS) ×1
GAUZE PACKING FOLDED 2 STR (GAUZE/BANDAGES/DRESSINGS) ×1 IMPLANT
GLOVE BIO SURGEON STRL SZ 6.5 (GLOVE) IMPLANT
GLOVE BIO SURGEON STRL SZ7 (GLOVE) IMPLANT
GLOVE BIO SURGEON STRL SZ8 (GLOVE) ×1 IMPLANT
GLOVE BIOGEL PI IND STRL 6.5 (GLOVE) IMPLANT
GLOVE BIOGEL PI IND STRL 7.0 (GLOVE) IMPLANT
GOWN STRL REUS W/ TWL LRG LVL3 (GOWN DISPOSABLE) ×1 IMPLANT
GOWN STRL REUS W/ TWL XL LVL3 (GOWN DISPOSABLE) ×1 IMPLANT
GOWN STRL REUS W/TWL LRG LVL3 (GOWN DISPOSABLE) ×1
GOWN STRL REUS W/TWL XL LVL3 (GOWN DISPOSABLE) ×1
IV NS 1000ML (IV SOLUTION) ×1
IV NS 1000ML BAXH (IV SOLUTION) ×1 IMPLANT
KIT BASIN OR (CUSTOM PROCEDURE TRAY) ×1 IMPLANT
KIT TURNOVER KIT B (KITS) ×1 IMPLANT
NDL HYPO 25GX1X1/2 BEV (NEEDLE) ×2 IMPLANT
NEEDLE HYPO 25GX1X1/2 BEV (NEEDLE) ×1 IMPLANT
NS IRRIG 1000ML POUR BTL (IV SOLUTION) ×1 IMPLANT
PAD ARMBOARD 7.5X6 YLW CONV (MISCELLANEOUS) ×1 IMPLANT
SLEEVE IRRIGATION ELITE 7 (MISCELLANEOUS) ×1 IMPLANT
SPIKE FLUID TRANSFER (MISCELLANEOUS) ×1 IMPLANT
SPONGE SURGIFOAM ABS GEL 12-7 (HEMOSTASIS) IMPLANT
SUCTION FRAZIER HANDLE 10FR (MISCELLANEOUS) ×1
SUCTION TUBE FRAZIER 10FR DISP (MISCELLANEOUS) IMPLANT
SUT CHROMIC 3 0 PS 2 (SUTURE) ×1 IMPLANT
SYR BULB IRRIG 60ML STRL (SYRINGE) ×1 IMPLANT
SYR CONTROL 10ML LL (SYRINGE) ×1 IMPLANT
TRAY ENT MC OR (CUSTOM PROCEDURE TRAY) ×1 IMPLANT
TUBING IRRIGATION (MISCELLANEOUS) ×1 IMPLANT
YANKAUER SUCT BULB TIP NO VENT (SUCTIONS) ×1 IMPLANT

## 2022-07-17 NOTE — H&P (Signed)
H&P documentation  -History and Physical Reviewed  -Patient has been re-examined  -No change in the plan of care  Breanna Dominguez  

## 2022-07-17 NOTE — Transfer of Care (Signed)
Immediate Anesthesia Transfer of Care Note  Patient: Breanna Dominguez  Procedure(s) Performed: DENTAL RESTORATION/EXTRACTIONS (Mouth)  Patient Location: PACU  Anesthesia Type:General  Level of Consciousness: drowsy, patient cooperative, and responds to stimulation  Airway & Oxygen Therapy: Patient Spontanous Breathing and Patient connected to face mask oxygen  Post-op Assessment: Report given to RN, Post -op Vital signs reviewed and stable, and Patient moving all extremities X 4  Post vital signs: Reviewed and stable  Last Vitals:  Vitals Value Taken Time  BP 120/68 07/17/22 0949  Temp    Pulse 97 07/17/22 0950  Resp 17 07/17/22 0950  SpO2 100 % 07/17/22 0950  Vitals shown include unvalidated device data.  Last Pain:  Vitals:   07/17/22 0757  TempSrc:   PainSc: 0-No pain         Complications: No notable events documented.

## 2022-07-17 NOTE — Op Note (Signed)
NAME: Breanna Dominguez, TORTORIELLO MEDICAL RECORD NO: [id number] ACCOUNT NO: [id number] DATE OF BIRTH: [birth year] FACILITY: [facility] LOCATION: [facility] PHYSICIAN: Gae Bon, DDS  Operative Report   DATE OF PROCEDURE: 07/17/2022  PREOPERATIVE DIAGNOSES:  Impacted teeth 1, 51, 52, 16, 66, 32. Nonrestorable tooth #15 secondary to dental caries.  POSTOPERATIVE DIAGNOSES:  Impacted teeth 1, 51, 52, 16, 66, 32. Nonrestorable tooth #15 secondary to dental caries.  PROCEDURE:  Extraction teeth 1, 51, 52, 15, 16, 66 and 32.  SURGEON:  Gae Bon, DDS  ANESTHESIA:  General, nasal intubation, Dr. Fransisco Beau attending.  DESCRIPTION OF PROCEDURE:  The patient was taken to the operating room and placed on the table in supine position.  General anesthesia was administered.  Nasoendotracheal tube was placed and secured.  The eyes were protected and the patient was draped  for surgery.  Timeout was performed.  The posterior pharynx was suctioned and a throat pack was placed.  2% lidocaine 1:100,000 epinephrine was infiltrated around teeth numbers 1, 16 and 32.  A bite block was placed on the right side of the mouth.  A  sweetheart retractor was used to retract the tongue.  A 15 blade was used to make an incision overlying impacted tooth #16 carried forward around tooth #15, both buccally and palatally in the gingival sulcus.  The periosteum was reflected.  Tooth #15 was  elevated and removed with the dental forceps.  Then, tissue was further reflected to expose the buccal plate.  Bone was removed using the Stryker handpiece and impacted tooth #66 was identified and removed with the 301 elevator.  Then, tooth #16 was  removed with 301 elevator and dental forceps.  The socket was curetted, irrigated and closed with 3-0 chromic.  The bite block was repositioned to the left side of the mouth.  The right mandible was operated on.  The 15 blade was used to make an incision  overlying tooth #32.  Periosteum was reflected.   Bone was removed using the Stryker handpiece. The tooth was sectioned into multiple pieces and removed with 301 elevator.  The socket was curetted, irrigated and closed with 3-0 chromic.  Then, the right  maxilla was operated.  The 15 blade was used to make an incision overlying tooth #1.  The incision was carried forward to the embracer between teeth number 2 and 3.  The flap was reflected and teeth #52 was removed with the rongeur.  Bone was removed  using the Stryker handpiece and 51 was identified and removed with the 301 elevator.  Tooth #1 was disto-angulated and required removal of bone and multiple attempts at elevation before the tooth was finally removed using the 301 elevator and the dental  forceps.  The socket was curetted, irrigated and closed with 3-0 chromic.  The oral cavity was then irrigated and suctioned.  Additional local anesthesia was administered.  The throat pack was removed.  The patient was left under care of anesthesia for  extubation and transported to recovery with plans for discharge home through day surgery.  ESTIMATED BLOOD LOSS:  Minimum.  COMPLICATIONS:  None.  SPECIMENS: None.  COUNTS:  Correct.      PAA D: 07/17/2022 9:43:57 am T: 07/17/2022 10:37:00 am  JOB: [id number]

## 2022-07-17 NOTE — Anesthesia Procedure Notes (Addendum)
Procedure Name: Intubation Date/Time: 07/17/2022 8:41 AM  Performed by: Michele Rockers, CRNAPre-anesthesia Checklist: Patient identified, Emergency Drugs available, Suction available and Patient being monitored Patient Re-evaluated:Patient Re-evaluated prior to induction Oxygen Delivery Method: Circle system utilized Preoxygenation: Pre-oxygenation with 100% oxygen Induction Type: IV induction, Rapid sequence and Cricoid Pressure applied Ventilation: Mask ventilation without difficulty Laryngoscope Size: Mac and 3 Grade View: Grade II Nasal Tubes: Nasal prep performed, Nasal Rae and Left Placement Confirmation: ETT inserted through vocal cords under direct vision, positive ETCO2 and breath sounds checked- equal and bilateral Tube secured with: Tape Dental Injury: Teeth and Oropharynx as per pre-operative assessment and Bloody posterior oropharynx

## 2022-07-17 NOTE — H&P (Signed)
Anesthesia H&P Update: History and Physical Exam reviewed; patient is OK for planned anesthetic and procedure. ? ?

## 2022-07-17 NOTE — Anesthesia Postprocedure Evaluation (Signed)
Anesthesia Post Note  Patient: Breanna Dominguez  Procedure(s) Performed: DENTAL RESTORATION/EXTRACTIONS (Mouth)     Patient location during evaluation: PACU Anesthesia Type: General Level of consciousness: awake and alert Pain management: pain level controlled Vital Signs Assessment: post-procedure vital signs reviewed and stable Respiratory status: spontaneous breathing, nonlabored ventilation and respiratory function stable Cardiovascular status: stable and blood pressure returned to baseline Anesthetic complications: no   No notable events documented.  Last Vitals:  Vitals:   07/17/22 1030 07/17/22 1045  BP: (!) 132/90 125/83  Pulse: 84 89  Resp: (!) 9 17  Temp:  (!) 36.4 C  SpO2: 93% 95%    Last Pain:  Vitals:   07/17/22 0950  TempSrc:   PainSc: 0-No pain                 Audry Pili

## 2022-07-17 NOTE — Op Note (Signed)
07/17/2022  9:36 AM  PATIENT:  Breanna Dominguez  22 y.o. female  PRE-OPERATIVE DIAGNOSIS:  IMPACTED TEETH # 1, 51, 52, 16, 66, 32, NONRESTORABLE TOOTH # 15 SECONDARY TO DENTAL CARIES  POST-OPERATIVE DIAGNOSIS:  SAME  PROCEDURE:  Procedure(s): EXTRACTION TEETH # 1, 51, 52, 15, 16, 66, 32  SURGEON:  Surgeon(s): Diona Browner, DMD  ANESTHESIA:   local and general  EBL:  minimal  DRAINS: none   SPECIMEN:  No Specimen  COUNTS:  YES  PLAN OF CARE: Discharge to home after PACU  PATIENT DISPOSITION:  PACU - hemodynamically stable.   PROCEDURE DETAILS: Dictation [id number]  Gae Bon, DMD 07/17/2022 9:36 AM

## 2022-07-17 NOTE — Anesthesia Preprocedure Evaluation (Addendum)
Anesthesia Evaluation  Patient identified by MRN, date of birth, ID band Patient awake    Reviewed: Allergy & Precautions, NPO status , Patient's Chart, lab work & pertinent test results  History of Anesthesia Complications Negative for: history of anesthetic complications  Airway Mallampati: II  TM Distance: >3 FB Neck ROM: Full    Dental  (+) Dental Advisory Given   Pulmonary asthma    Pulmonary exam normal        Cardiovascular negative cardio ROS Normal cardiovascular exam     Neuro/Psych  Myelin oligodendrocyte glycoprotein antibody disorder   negative psych ROS   GI/Hepatic negative GI ROS, Neg liver ROS,,,  Endo/Other    Morbid obesity Obesity   Renal/GU negative Renal ROS     Musculoskeletal negative musculoskeletal ROS (+)    Abdominal   Peds  Hematology negative hematology ROS (+)   Anesthesia Other Findings   Reproductive/Obstetrics                             Anesthesia Physical Anesthesia Plan  ASA: 3  Anesthesia Plan: General   Post-op Pain Management: Tylenol PO (pre-op)*   Induction: Intravenous  PONV Risk Score and Plan: 3 and Treatment may vary due to age or medical condition, Ondansetron, Dexamethasone and Midazolam  Airway Management Planned: Nasal ETT  Additional Equipment: None  Intra-op Plan:   Post-operative Plan: Extubation in OR  Informed Consent: I have reviewed the patients History and Physical, chart, labs and discussed the procedure including the risks, benefits and alternatives for the proposed anesthesia with the patient or authorized representative who has indicated his/her understanding and acceptance.     Dental advisory given  Plan Discussed with: CRNA and Anesthesiologist  Anesthesia Plan Comments:        Anesthesia Quick Evaluation

## 2022-07-18 ENCOUNTER — Encounter (HOSPITAL_COMMUNITY): Payer: Self-pay | Admitting: Oral Surgery

## 2022-12-03 ENCOUNTER — Encounter: Payer: Self-pay | Admitting: Neurology

## 2022-12-03 ENCOUNTER — Ambulatory Visit: Payer: Medicaid Other | Admitting: Neurology

## 2023-09-21 IMAGING — MR MR THORACIC SPINE WO/W CM
8 of 17 series · 19 of 48 positions shown · IV contrast (10 GAD)
Comparison: None.

CLINICAL DATA: Optic neuritis

EXAM:
MRI CERVICAL AND THORACIC SPINE WITHOUT AND WITH CONTRAST
TECHNIQUE: Multiplanar and multiecho pulse sequences of the cervical spine, to
include the craniocervical junction and cervicothoracic junction,
and the thoracic spine, were obtained without and with intravenous
contrast.
CONTRAST:  10mL GADAVIST GADOBUTROL 1 MMOL/ML IV SOLN

[Series 3: T2 · sagittal · 3.0mm · 0.43mm/px · 1 of 14 slices shown (1 of 4)]
[im 1/14]
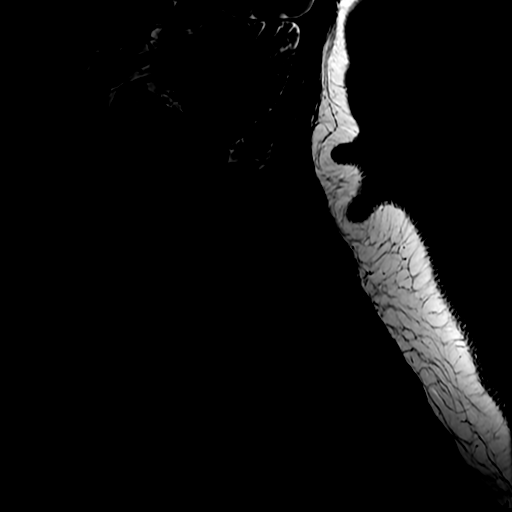

[Series 7: T2 · axial · 3.0mm · 0.35mm/px · z∈[-98,-16]mm · 3 of 27 slices shown (2 of 4)]
[im 1/27]
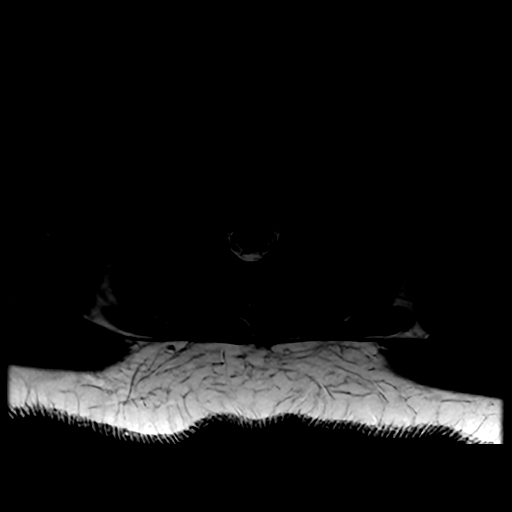
[im 14/27]
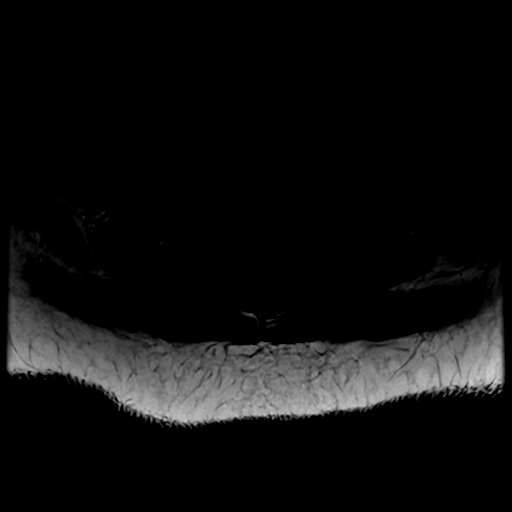
[im 27/27]
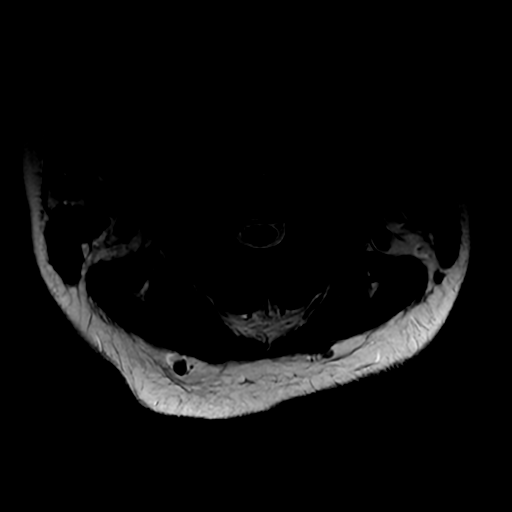

[Series 8: T1 · axial · non-contrast · 3.0mm · 0.35mm/px · z∈[-98,-16]mm · 3 of 27 slices shown (1 of 4)]
[im 1/27]
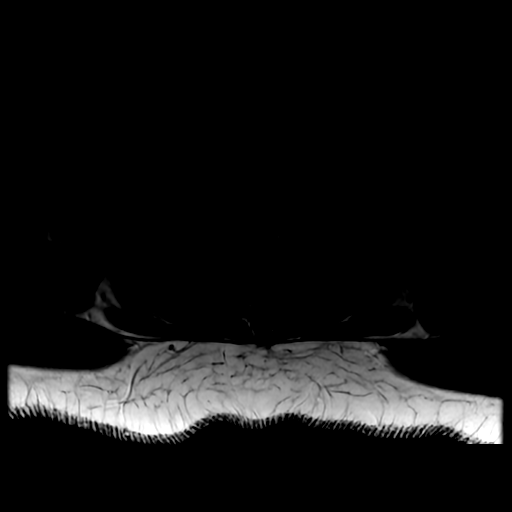
[im 14/27]
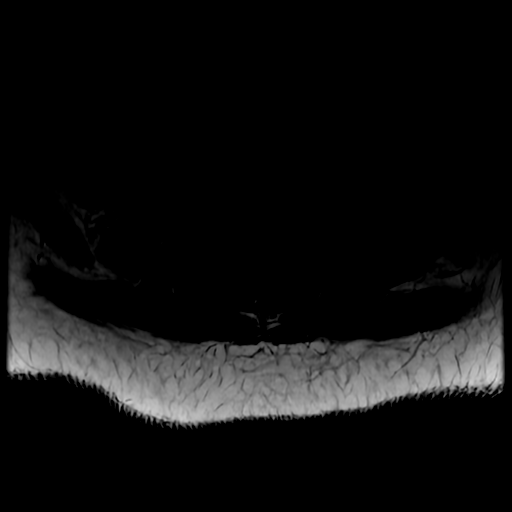
[im 27/27]
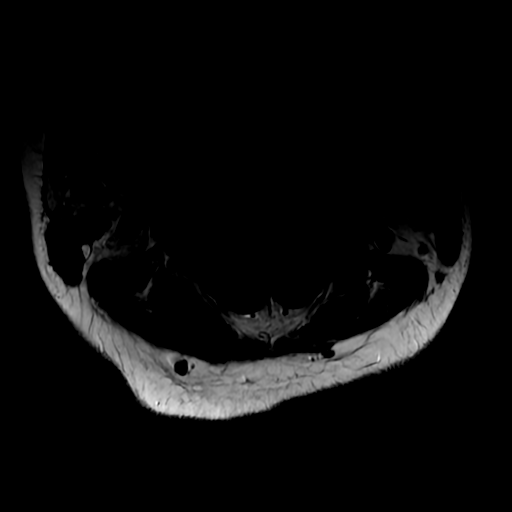

[Series 10: T1 · sagittal · 3.0mm · 0.90mm/px · 2 of 14 slices shown (2 of 4)]
[im 1/14]
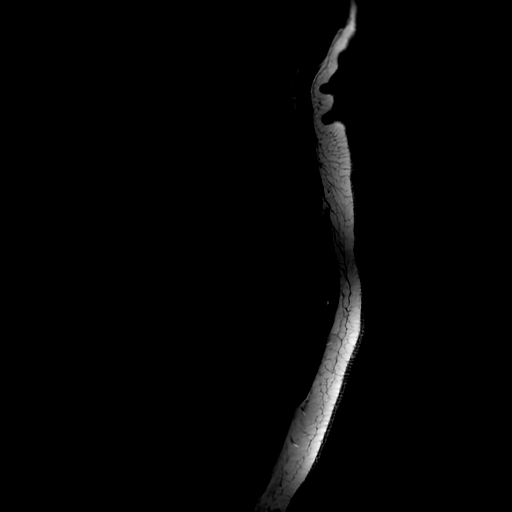
[im 14/14]
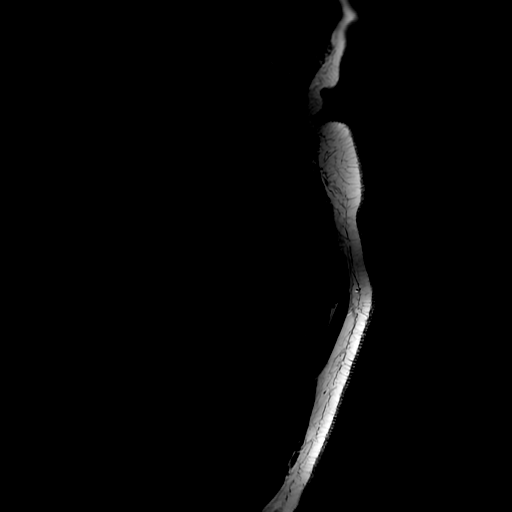

[Series 11: T2 · sagittal · 3.0mm · 0.66mm/px · 2 of 15 slices shown (3 of 4)]
[im 1/15]
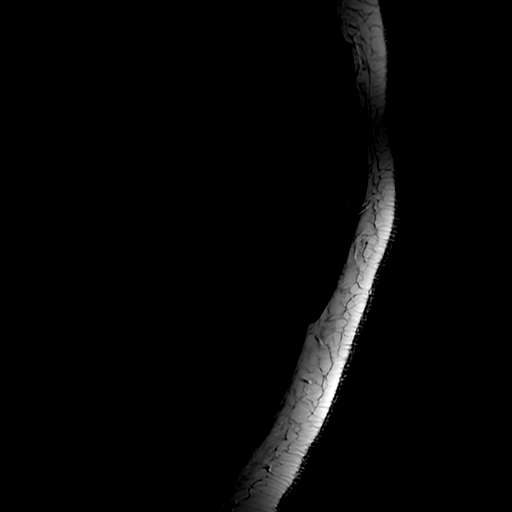
[im 15/15]
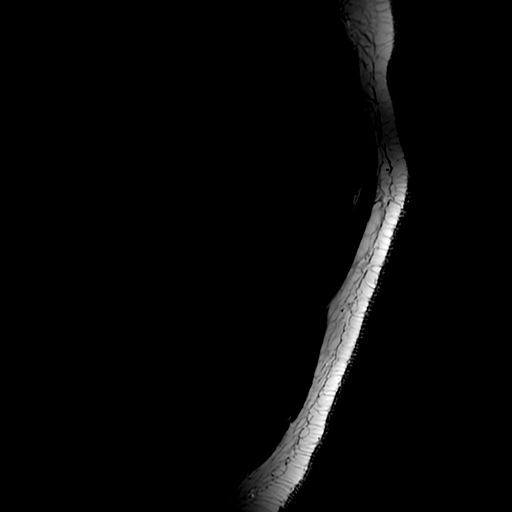

[Series 13: T1 · sagittal · 3.0mm · 0.66mm/px · 2 of 15 slices shown (3 of 4)]
[im 1/15]
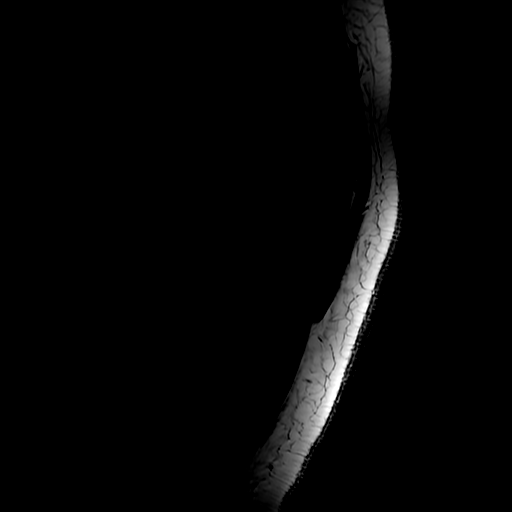
[im 15/15]
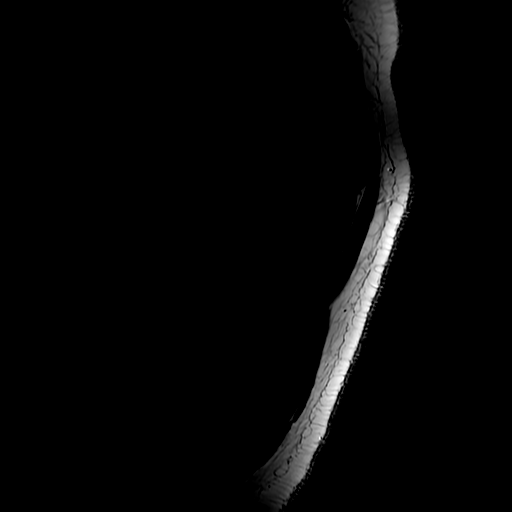

[Series 14: T2 · axial · 4.0mm · 0.39mm/px · z∈[-348,-111]mm · 4 of 38 slices shown (4 of 4)]
[im 1/38]
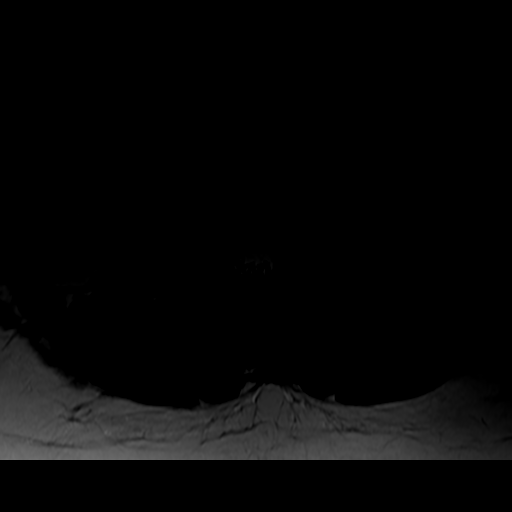
[im 13/38]
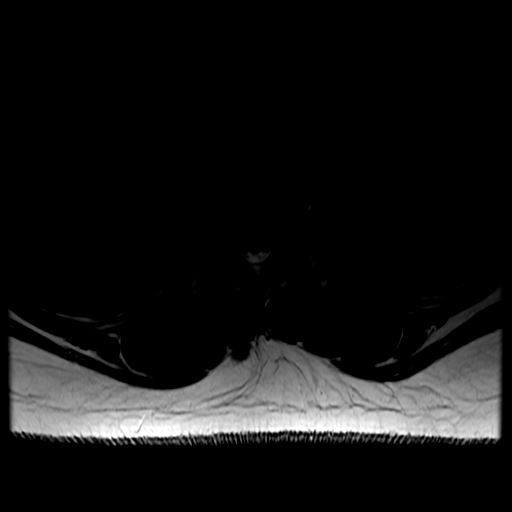
[im 25/38]
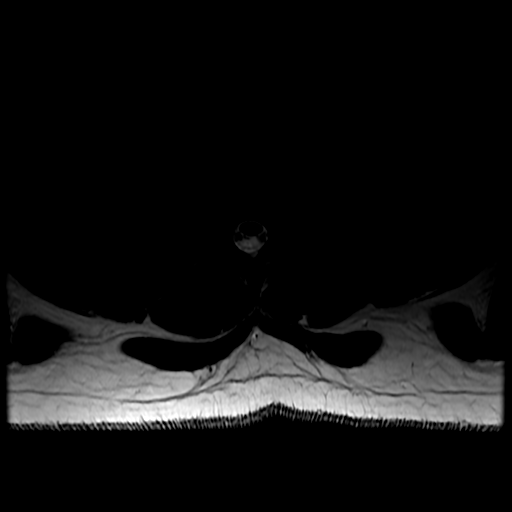
[im 38/38]
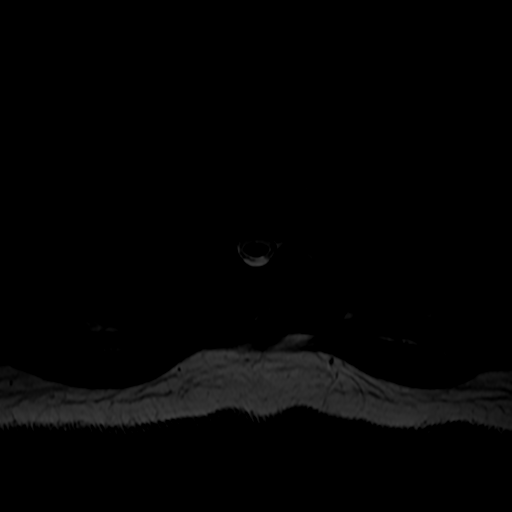

[Series 16: T1 · axial · non-contrast · 4.0mm · 0.39mm/px · z∈[-348,-248]mm · 2 of 38 slices shown (4 of 4)]
[im 1/38]
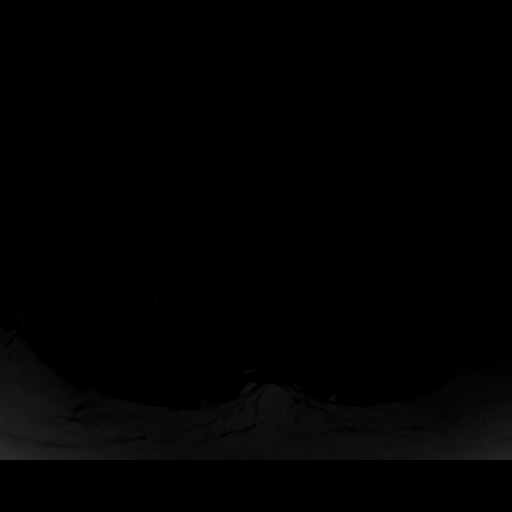
[im 13/38]
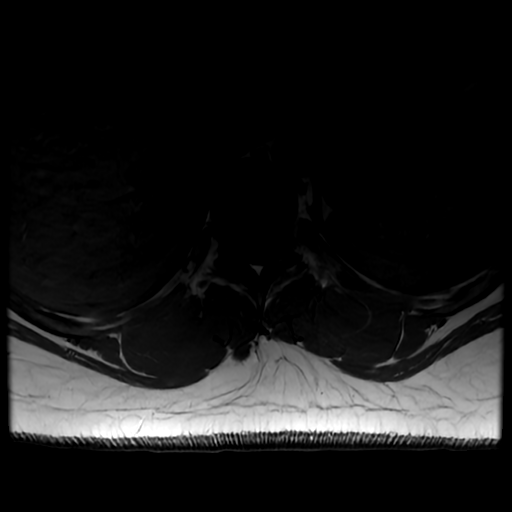

[19 of 48 positions shown; findings below may reference images not displayed]

FINDINGS: MRI CERVICAL SPINE

Motion artifact is present.

Alignment: Preserved.

Vertebrae: Vertebral body heights are maintained. No marrow edema.
No suspicious osseous lesion.

Cord: No definite abnormal signal.  No abnormal enhancement.

Posterior Fossa, vertebral arteries, paraspinal tissues: Top-normal
retropharyngeal lymph nodes are probably reactive.

Disc levels: Intervertebral disc heights are maintained. Disc bulge
with punctate right paracentral annular fissure at C4-C5 disc bulges
at C5-C6 and C6-C7. No canal or foraminal stenosis at any level.

MRI THORACIC SPINE

Motion artifact is present.

Alignment: Preserved.

Vertebrae: Vertebral body heights are maintained. No marrow edema.
No suspicious osseous lesion.

Cord:  No definite abnormal signal.  No abnormal enhancement.

Paraspinal and other soft tissues: Unremarkable.

Disc levels: Intervertebral disc heights and signal are maintained.
No canal or foraminal stenosis at any level.
IMPRESSION: No abnormal cord signal or enhancement to suggest demyelinating
disease within limitation of motion degradation.
